# Patient Record
Sex: Male | Born: 1965 | Race: White | Hispanic: No | Marital: Single | Smoking: Former smoker
Health system: Southern US, Community
[De-identification: ages and names within clinical notes are randomized; demographics above are authoritative.]

## PROBLEM LIST (undated history)

## (undated) DIAGNOSIS — K449 Diaphragmatic hernia without obstruction or gangrene: Secondary | ICD-10-CM

## (undated) DIAGNOSIS — K219 Gastro-esophageal reflux disease without esophagitis: Secondary | ICD-10-CM

## (undated) HISTORY — DX: Diaphragmatic hernia without obstruction or gangrene: K44.9

## (undated) HISTORY — PX: TONSILLECTOMY: SUR1361

## (undated) HISTORY — DX: Gastro-esophageal reflux disease without esophagitis: K21.9

---

## 2005-03-07 ENCOUNTER — Emergency Department: Payer: Self-pay | Admitting: Emergency Medicine

## 2007-07-31 ENCOUNTER — Ambulatory Visit: Payer: Self-pay | Admitting: Family Medicine

## 2008-11-26 ENCOUNTER — Ambulatory Visit: Payer: Self-pay | Admitting: Internal Medicine

## 2008-12-06 ENCOUNTER — Ambulatory Visit: Payer: Self-pay | Admitting: Family Medicine

## 2008-12-25 ENCOUNTER — Ambulatory Visit: Payer: Self-pay | Admitting: Internal Medicine

## 2010-12-10 IMAGING — CR RIGHT INDEX FINGER 2+V
1 series · 3 of 3 positions shown · non-contrast
Comparison: None

REASON FOR EXAM: laceration
COMMENTS:

PROCEDURE:     MDR - MDR FINGER INDEX 2ND DIG RT HA  - November 26, 2008  [DATE]
RESULT:     History: Laceration

[Series 1: view not recorded · 0.17mm/px · 3 of 3 slices shown]
[im 1/3]
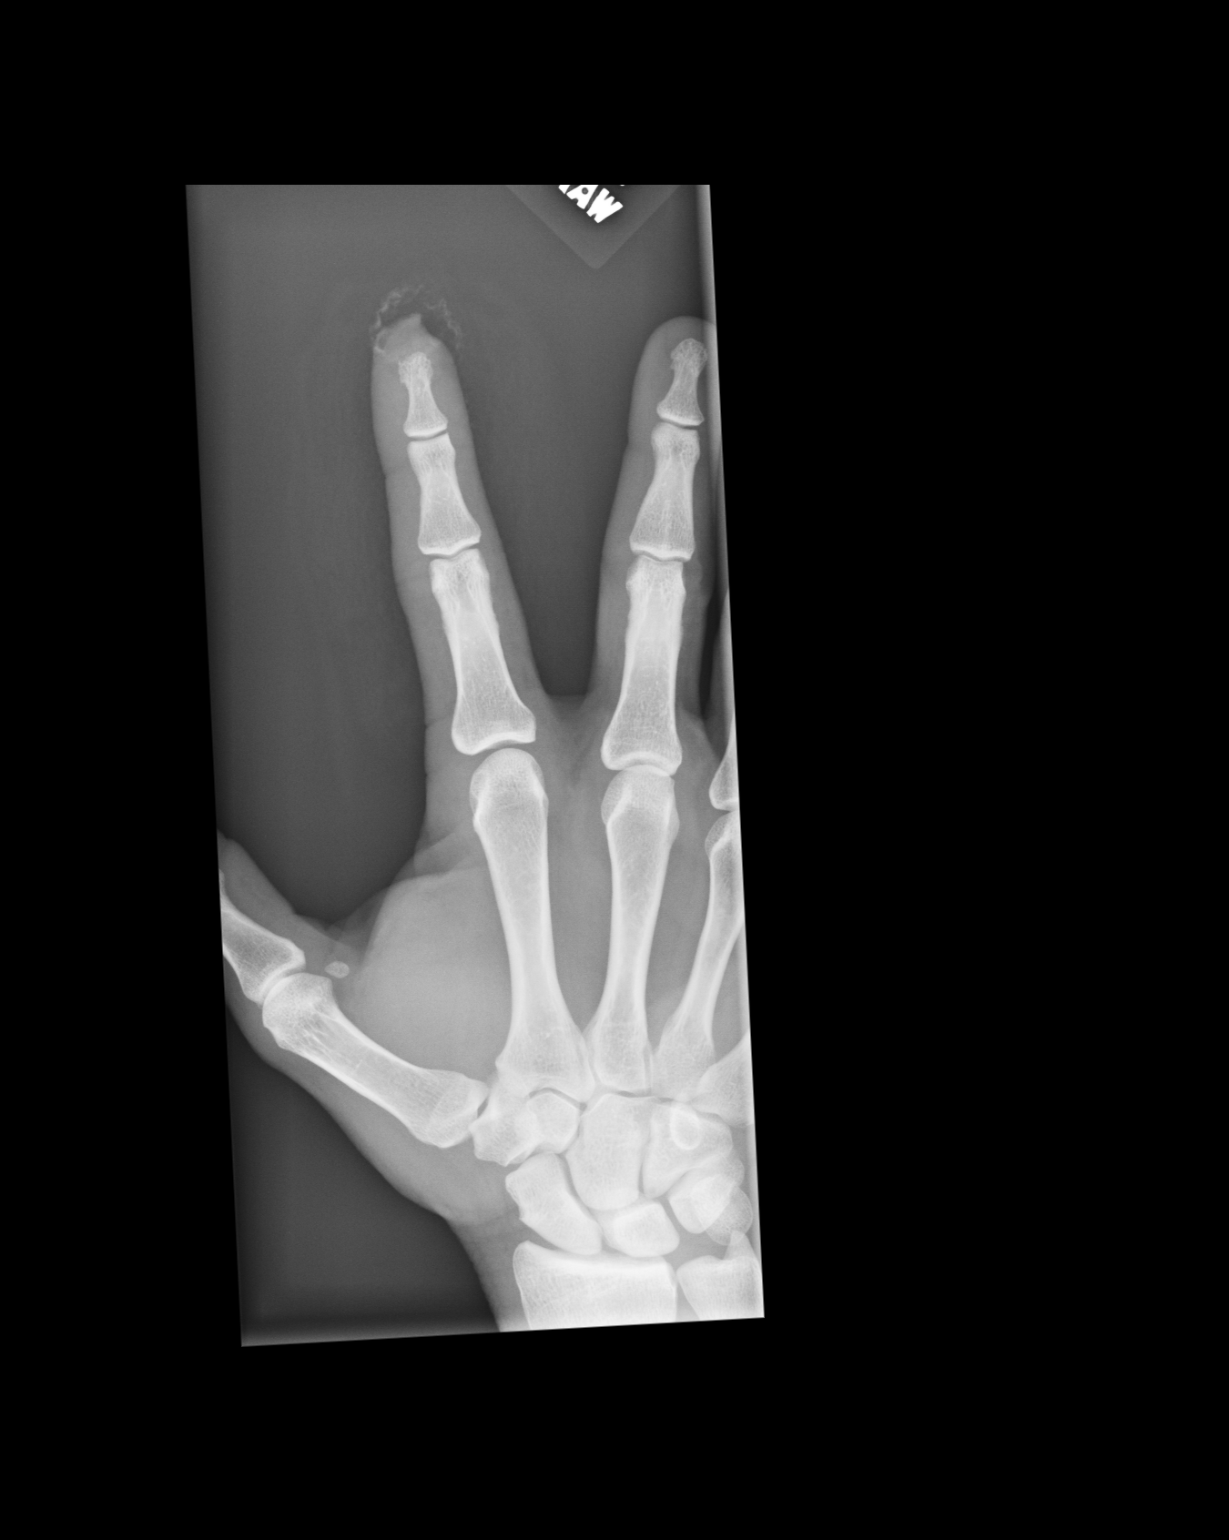
[im 2/3]
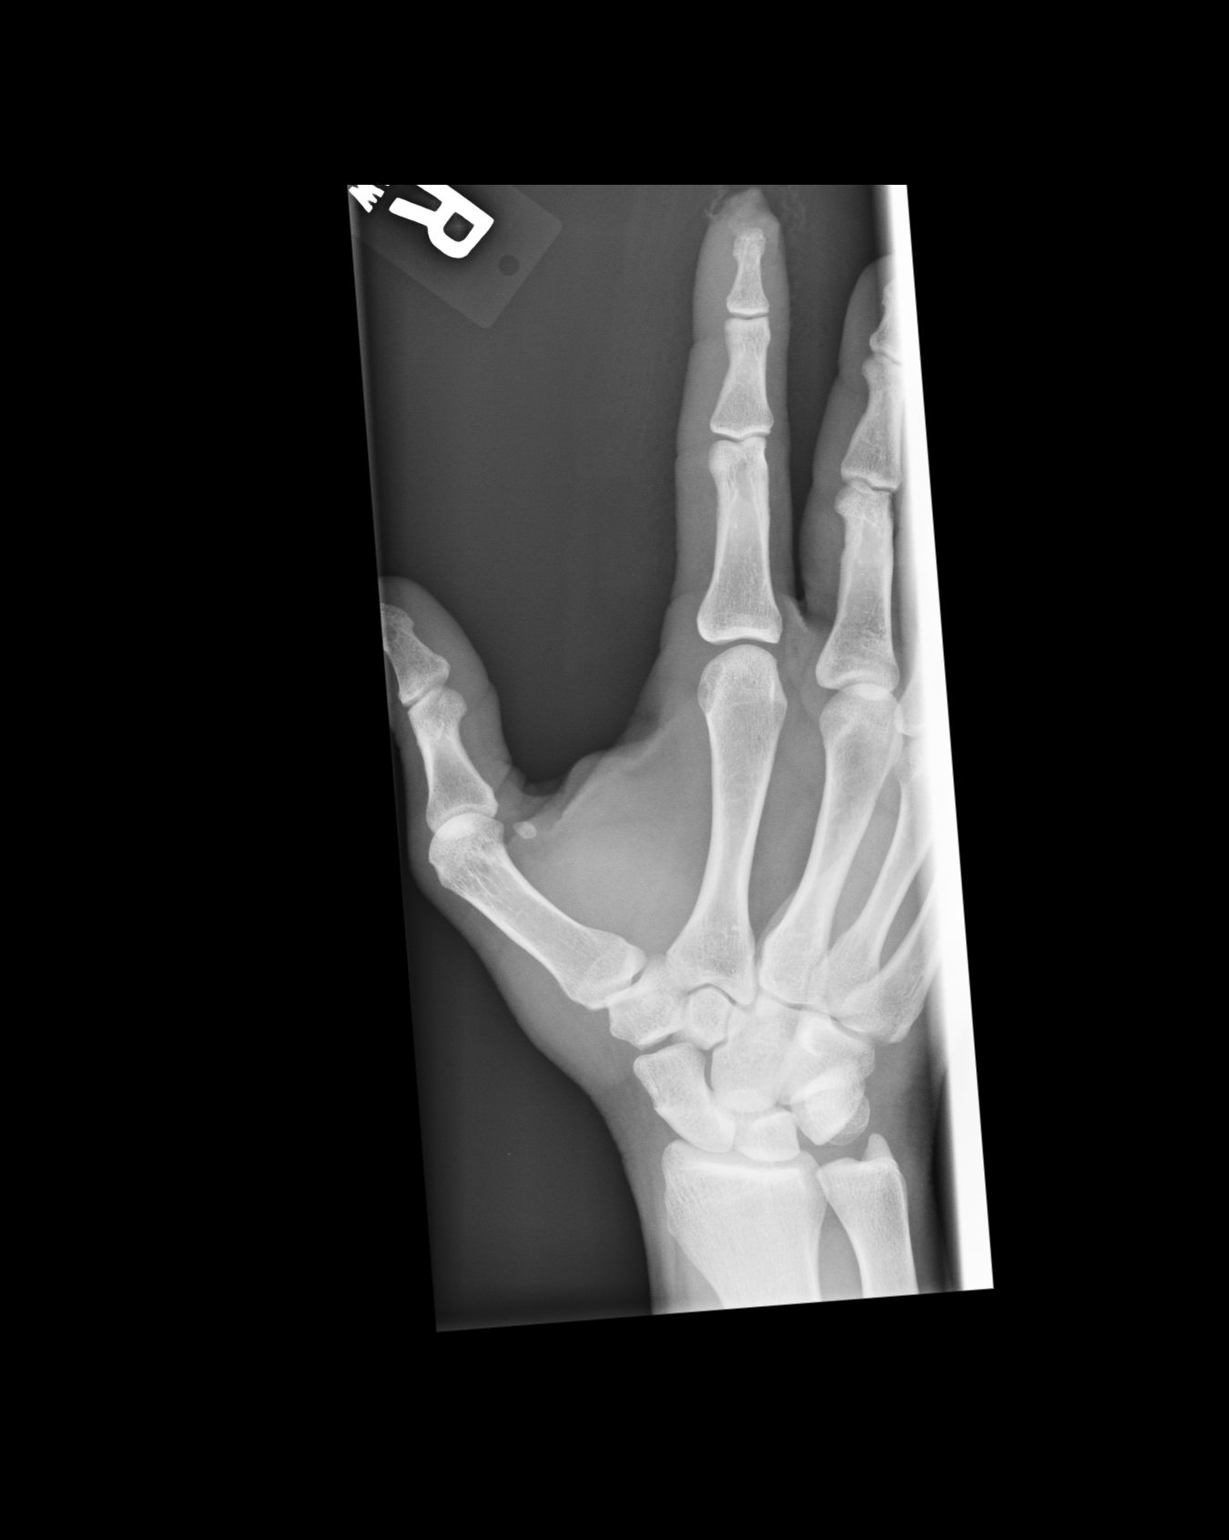
[im 3/3]
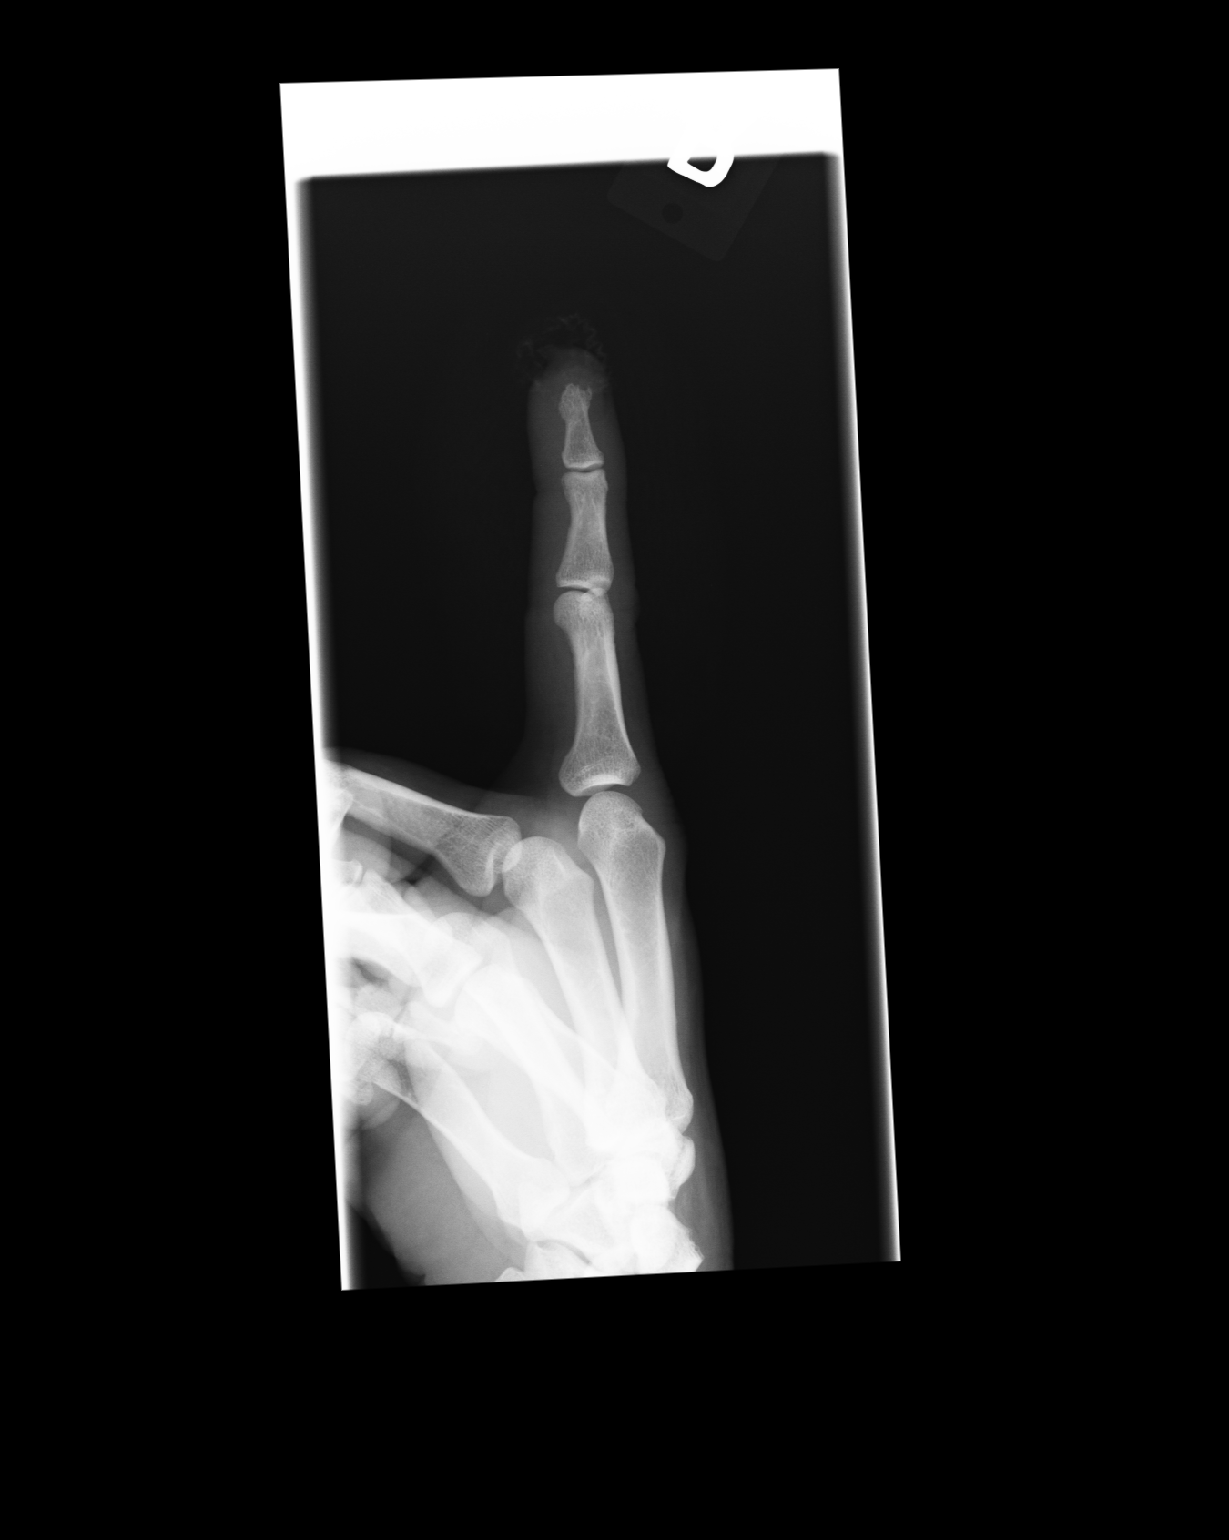

[3 of 3 positions shown; findings below may reference images not displayed]

FINDINGS: Three coned-down views of the right second digit demonstrates a small
osseous fragment distal to the distal tuft of the second phalanx consistent
with a fracture of the tuft. There is a laceration extending to this area.
Given the overall findings is consistent with an open fracture.

No other fractures are identified. There is no dislocation.
IMPRESSION: Please see above

## 2016-03-14 ENCOUNTER — Emergency Department
Admission: EM | Admit: 2016-03-14 | Discharge: 2016-03-14 | Disposition: A | Discharge status: Home / Self Care | Payer: BLUE CROSS/BLUE SHIELD | Attending: Emergency Medicine | Admitting: Emergency Medicine

## 2016-03-14 ENCOUNTER — Encounter: Payer: Self-pay | Admitting: Emergency Medicine

## 2016-03-14 DIAGNOSIS — Z87891 Personal history of nicotine dependence: Secondary | ICD-10-CM | POA: Diagnosis not present

## 2016-03-14 DIAGNOSIS — R202 Paresthesia of skin: Secondary | ICD-10-CM

## 2016-03-14 DIAGNOSIS — R9431 Abnormal electrocardiogram [ECG] [EKG]: Secondary | ICD-10-CM | POA: Insufficient documentation

## 2016-03-14 LAB — BASIC METABOLIC PANEL
ANION GAP: 7 (ref 5–15)
BUN: 26 mg/dL — AB (ref 6–20)
CALCIUM: 9.2 mg/dL (ref 8.9–10.3)
CO2: 26 mmol/L (ref 22–32)
CREATININE: 0.67 mg/dL (ref 0.61–1.24)
Chloride: 105 mmol/L (ref 101–111)
GFR calc Af Amer: >60 mL/min (ref >60)
GLUCOSE: 93 mg/dL (ref 65–99)
Potassium: 3.9 mmol/L (ref 3.5–5.1)
Sodium: 138 mmol/L (ref 135–145)

## 2016-03-14 LAB — CBC
HCT: 41.2 % (ref 40.0–52.0)
Hemoglobin: 14.4 g/dL (ref 13.0–18.0)
MCH: 30.5 pg (ref 26.0–34.0)
MCHC: 35 g/dL (ref 32.0–36.0)
MCV: 87.1 fL (ref 80.0–100.0)
PLATELETS: 221 10*3/uL (ref 150–440)
RBC: 4.73 MIL/uL (ref 4.40–5.90)
RDW: 13.7 % (ref 11.5–14.5)
WBC: 8.9 10*3/uL (ref 3.8–10.6)

## 2016-03-14 LAB — TROPONIN I: Troponin I: <0.03 ng/mL (ref <0.03)

## 2016-03-14 NOTE — ED Triage Notes (Signed)
Pt presents to ED from his PCP who sent him for referral for EKG changes noted today. Pt denies chest pain but reports a funny feeling in his left fingers.

## 2016-03-14 NOTE — Discharge Instructions (Signed)
As we discussed, although your EKG has some abnormalities, there is nothing to suggest you are having heart attack.  Additionally because of your general good health, you are at very low risk for having a serious heart issue which is resulting in EKG changes.  We encourage you to continue taking a baby aspirin each day and to call Dr. Welton FlakesKhan on Monday to schedule the next available clinic appointment.  Return to the emergency department with new or worsening symptoms that concern you.

## 2016-03-14 NOTE — ED Provider Notes (Signed)
[facility] Emergency Department Provider Note  ____________________________________________   First MD Initiated Contact with Patient 03/14/16 2003     (approximate)  I have reviewed the triage vital signs and the nursing notes.   HISTORY  Chief Complaint Other (EKG changes)    HPI Steven Odonnell is a 50 y.o. male with no significant past medical history who presents for evaluation of EKG changes at the request of his primary care doctor.  He came to his primary care doctor earlier today due to some numbness and tingling in the index and middle finger of his right hand which is been intermittent over the last week.  He has felt a little bit run down with some general malaise but no specific symptoms other than the paresthesias.  However when his primary care doctor did an EKG he was concerned about some abnormalities and gave him a full dose aspirin and ask him to go to the emergency department.  Patient denies fever/chills, shortness of breath, abdominal pain, nausea, vomiting, episodes of diaphoresis, dysuria.  He states that over the last week or so he occasionally has "twinges" in his chest, usually when he is working, but does not describe it as pain.  He says it occasionally feels like a mild tingle or tickle but is not prolonged, very minor in severity, nothing in particular makes it better nor worse.  She is he has had no weakness in any of his extremities, just some tingling and numbness in the index and middle finger of his right/dominant hand.  It is worsened when he is working.  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   History reviewed. No pertinent past medical history.  There are no active problems to display for this patient.   Past Surgical History:  Procedure Laterality Date  . TONSILLECTOMY      Prior to Admission medications   Not on File    Allergies Review of patient's  allergies indicates no known allergies.  No family history on file.  Social History Social History  Substance Use Topics  . Smoking status: Former Games developermoker  . Smokeless tobacco: Never Used  . Alcohol use Yes    Review of Systems Constitutional: No fever/chills Eyes: No visual changes. ENT: No sore throat. Cardiovascular: Denies chest pain, occasional chest "twinge" Respiratory: Denies shortness of breath. Gastrointestinal: No abdominal pain.  No nausea, no vomiting.  No diarrhea.  No constipation. Genitourinary: Negative for dysuria. Musculoskeletal: Negative for back pain. Skin: Negative for rash. Neurological: Negative for headaches, focal weakness or numbness.  10-point ROS otherwise negative.  ____________________________________________   PHYSICAL EXAM:  VITAL SIGNS: ED Triage Vitals  Enc Vitals Group     BP 03/14/16 1702 138/84     Pulse Rate 03/14/16 1702 87     Resp 03/14/16 1702 18     Temp 03/14/16 1702 97.7 F (36.5 C)     Temp Source 03/14/16 1702 Oral     SpO2 03/14/16 1702 98 %     Weight 03/14/16 1702 145 lb (65.8 kg)     Height 03/14/16 1702 5\' 6"  (1.676 m)     Head Circumference --      Peak Flow --      Pain Score 03/14/16 1916 2     Pain Loc --      Pain Edu? --      Excl. in GC? --     Constitutional: Alert  and oriented. Well appearing and in no acute distress. Eyes: Conjunctivae are normal. PERRL. EOMI. Head: Atraumatic. Nose: No congestion/rhinnorhea. Mouth/Throat: Mucous membranes are moist.  Oropharynx non-erythematous. Neck: No stridor.  No meningeal signs.   Cardiovascular: Normal rate, regular rhythm. Good peripheral circulation. Grossly normal heart sounds. Respiratory: Normal respiratory effort.  No retractions. Lungs CTAB. Gastrointestinal: Soft and nontender. No distention.  Musculoskeletal: No lower extremity tenderness nor edema. No gross deformities of extremities. Neurologic:  Normal speech and language. No gross focal  neurologic deficits are appreciated.  Skin:  Skin is warm, dry and intact. No rash noted. Psychiatric: Mood and affect are normal. Speech and behavior are normal.  ____________________________________________   LABS (all labs ordered are listed, but only abnormal results are displayed)  Labs Reviewed  BASIC METABOLIC PANEL - Abnormal; Notable for the following:       Result Value   BUN 26 (*)    All other components within normal limits  CBC  TROPONIN I   ____________________________________________  EKG  ED ECG REPORT I, Ivis Nicolson, the attending physician, personally viewed and interpreted this ECG.   Date: 03/14/2016  EKG Time: 17:03  Rate: 89  Rhythm: normal sinus rhythm  Axis: Left axis deviation  Intervals:Incomplete right bundle branch block.  There is appearance almost of U waves in leads V4 and V5  ST&T Change: Non-specific ST segment / T-wave changes, but no evidence of acute ischemia.   ____________________________________________  RADIOLOGY  No results found.  ____________________________________________   PROCEDURES  Procedure(s) performed:   Procedures   Critical Care performed: No ____________________________________________   INITIAL IMPRESSION / ASSESSMENT AND PLAN / ED COURSE  Pertinent labs & imaging results that were available during my care of the patient were reviewed by me and considered in my medical decision making (see chart for details).  She is well-appearing and in no acute distress with no report of chest pain or shortness of breath.  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  His EKG is somewhat odd but not indicative of acute ischemia and his labs are normal and reassuring. HEART score is 2.  Appropriate for outpatient follow-up.  I gave the name and number of a local cardiologist and encouraged him to call on Monday.  He will also continue to take low-dose aspirin.  I gave  my usual and customary return precautions.   ____________________________________________  FINAL CLINICAL IMPRESSION(S) / ED DIAGNOSES  Final diagnoses:  Right hand paresthesia  EKG abnormalities     MEDICATIONS GIVEN DURING THIS VISIT:  Medications - No data to display   NEW OUTPATIENT MEDICATIONS STARTED DURING THIS VISIT:  New Prescriptions   No medications on file      Note:  This document was prepared using Dragon voice recognition software and may include unintentional dictation errors.    Loleta Roseory Abu Heavin, MD 03/14/16 2029

## 2018-03-16 ENCOUNTER — Encounter: Payer: Self-pay | Admitting: Emergency Medicine

## 2018-03-16 ENCOUNTER — Ambulatory Visit
Admission: EM | Admit: 2018-03-16 | Discharge: 2018-03-16 | Disposition: A | Discharge status: Home / Self Care | Payer: BLUE CROSS/BLUE SHIELD | Attending: Family Medicine | Admitting: Family Medicine

## 2018-03-16 ENCOUNTER — Other Ambulatory Visit: Payer: Self-pay

## 2018-03-16 DIAGNOSIS — L03211 Cellulitis of face: Secondary | ICD-10-CM | POA: Diagnosis not present

## 2018-03-16 DIAGNOSIS — H60393 Other infective otitis externa, bilateral: Secondary | ICD-10-CM | POA: Diagnosis not present

## 2018-03-16 MED ORDER — CIPROFLOXACIN-DEXAMETHASONE 0.3-0.1 % OT SUSP: 4.0000 [drp] | Freq: Two times a day (BID) | OTIC | 0 refills | Status: DC

## 2018-03-16 MED ORDER — MUPIROCIN 2 % EX OINT: 1.0000 "application " | TOPICAL_OINTMENT | Freq: Three times a day (TID) | CUTANEOUS | 0 refills | Status: DC

## 2018-03-16 MED ORDER — DOXYCYCLINE HYCLATE 100 MG PO CAPS: 100.0000 mg | ORAL_CAPSULE | Freq: Two times a day (BID) | ORAL | 0 refills | Status: DC

## 2018-03-16 NOTE — ED Provider Notes (Addendum)
[facility]    CSN: [id number] Arrival date & time: 03/16/18  1707     History   Chief Complaint Chief Complaint  Patient presents with  . swelling right ear    HPI Steven Odonnell is a 52 y.o. male.   HPI  52 year old male presents with a swollen right ear over the tragus.  [facility]/2018 with a external otitis he states was very swollen and the practitioner had trouble introducing the speculum into his ear.  He was treated appropriately with Cortisporin otic was doing better.  However the last several days he has noticed swelling of the tragus with redness and tenderness.  Is complaining of similar symptoms to the external otitis he had in the right ear now in the left ear.  He tends to manipulate his ears frequently swears he is not using Q-tips any longer.  He denies any fever or chills.        History reviewed. No pertinent past medical history.  There are no active problems to display for this patient.   Past Surgical History:  Procedure Laterality Date  . TONSILLECTOMY         Home Medications    Prior to Admission medications   Medication Sig Start Date End Date Taking? Authorizing Provider  ciprofloxacin-dexamethasone (CIPRODEX) OTIC suspension Place 4 drops into both ears 2 (two) times daily. 03/16/18   Lutricia Feiloemer, Chalet Kerwin P, PA-C  doxycycline (VIBRAMYCIN) 100 MG capsule Take 1 capsule (100 mg total) by mouth 2 (two) times daily. 03/16/18   Lutricia Feiloemer, Naria Abbey P, PA-C  mupirocin ointment (BACTROBAN) 2 % Apply 1 application topically 3 (three) times daily. 03/16/18   Lutricia Feiloemer, Maevis Mumby P, PA-C    Family History Family History  Problem Relation Age of Onset  . Diabetes Mother   . Hypertension Mother   . Lung cancer Father     Social History Social History   Tobacco Use  . Smoking status: Former Smoker    Last attempt to quit: 03/16/1998    Years since quitting: 20.0  . Smokeless tobacco: Never Used  Substance Use  Topics  . Alcohol use: Not Currently  . Drug use: No     Allergies   Patient has no known allergies.   Review of Systems Review of Systems  Constitutional: Positive for activity change. Negative for appetite change, chills and fatigue.  HENT: Positive for ear pain.   All other systems reviewed and are negative.    Physical Exam Triage Vital Signs ED Triage Vitals  Enc Vitals Group     BP 03/16/18 1726 (!) 137/94     Pulse Rate 03/16/18 1726 98     Resp 03/16/18 1726 16     Temp 03/16/18 1726 98.1 F (36.7 C)     Temp Source 03/16/18 1726 Oral     SpO2 03/16/18 1726 98 %     Weight 03/16/18 1727 165 lb (74.8 kg)     Height 03/16/18 1727 5\' 6"  (1.676 m)     Head Circumference --      Peak Flow --      Pain Score 03/16/18 1726 6     Pain Loc --      Pain Edu? --      Excl. in GC? --    No data found.  Updated Vital Signs BP (!) 137/94 (BP Location: Left Arm)   Pulse 98   Temp 98.1 F (36.7 C) (Oral)   Resp 16  Ht 5\' 6"  (1.676 m)   Wt 165 lb (74.8 kg)   SpO2 98%   BMI 26.63 kg/m   Visual Acuity Right Eye Distance:   Left Eye Distance:   Bilateral Distance:    Right Eye Near:   Left Eye Near:    Bilateral Near:     Physical Exam  Constitutional: He is oriented to person, place, and time. He appears well-developed and well-nourished. No distress.  HENT:  Head: Normocephalic.  Nose: Nose normal.  Mouth/Throat: Oropharynx is clear and moist. No oropharyngeal exudate.  Examination of the right ears shows a swollen tender erythematous tragus extending into the face.  At the posterior superior part of the tragus is a small pustule.  Is mild induration but no fluctuance which is pointing.  Examination  now shows much less swelling but still reveals debris in the canal inferiorly.  The TM was partially occluded with swelling but appeared normal.  The left ear shows pain with movement of the tragus but examination of the canal shows again debris and a moderate  amount of swelling present.  TM is visualized and appears normal but dull.  Eyes: Pupils are equal, round, and reactive to light. Right eye exhibits no discharge. Left eye exhibits no discharge.  Neck: Normal range of motion. Neck supple.  Musculoskeletal: Normal range of motion.  Neurological: He is alert and oriented to person, place, and time.  Skin: Skin is warm and dry. He is not diaphoretic.  Psychiatric: He has a normal mood and affect. His behavior is normal. Judgment and thought content normal.  Nursing note and vitals reviewed.        UC Treatments / Results  Labs (all labs ordered are listed, but only abnormal results are displayed) Labs Reviewed - No data to display  EKG None  Radiology No results found.  Procedures Procedures (including critical care time)  Medications Ordered in UC Medications - No data to display  Initial Impression / Assessment and Plan / UC Course  I have reviewed the triage vital signs and the nursing notes.  Pertinent labs & imaging results that were available during my care of the patient were reviewed by me and considered in my medical decision making (see chart for details).     Plan: 1. Test/x-ray results and diagnosis reviewed with patient 2. rx as per orders; risks, benefits, potential side effects reviewed with patient 3. Recommend supportive treatment with warm compresses applied to the right external ear tragus 3 Imes daily.  Specific and explicit instructions were provided to the patient as well as provided in a narrative form on his AVS.   I will give him a second course of Ciprodex for his ears they appeared to not be completely healed.  Apply Bactroban ointment to the tragus after the warm compresses and I have also placed him on doxycycline for the cellulitis.  Not improving he may return to our clinic or return to his [facility]. 4. F/u prn if symptoms worsen or don't improve  Final  Clinical Impressions(s) / UC Diagnoses   Final diagnoses:  Cellulitis of face  Infective otitis externa of both ears     Discharge Instructions     Wet a washcloth with water place in microwave for 10 to 15 seconds until warm.  Place over right external ear and leave for 10 minutes.  Dry thoroughly and apply mupirocin ointment to the area of redness and pain.  Perform this 3  times daily.  Take doxycycline by mouth until prescription is completed.  Use the eardrops twice daily for 5 to 7 days. Do Not manipulate your ear.  Follow-up with your primary care physician   ED Prescriptions    Medication Sig Dispense Auth. Provider   doxycycline (VIBRAMYCIN) 100 MG capsule Take 1 capsule (100 mg total) by mouth 2 (two) times daily. 20 capsule Ovid Curd P, PA-C   mupirocin ointment (BACTROBAN) 2 % Apply 1 application topically 3 (three) times daily. 22 g Ovid Curd P, PA-C   ciprofloxacin-dexamethasone (CIPRODEX) OTIC suspension  (Status: Discontinued) Place 4 drops into both ears 2 (two) times daily. 7.5 mL Lutricia Feil, PA-C   ciprofloxacin-dexamethasone (CIPRODEX) OTIC suspension Place 4 drops into both ears 2 (two) times daily. 7.5 mL Lutricia Feil, PA-C     Controlled Substance Prescriptions  Controlled Substance Registry consulted? Not Applicable   Lutricia Feil, PA-C 03/16/18 1839    Lutricia Feil, PA-C 03/16/18 1840    Lutricia Feil, PA-C 03/16/18 1937

## 2018-03-16 NOTE — ED Triage Notes (Signed)
Patient in today c/o right ear redness, swelling and pain x 2-3 days. Patient had swimmer's ear ~2 weeks ago and [facility] and was treated with antibiotic drops.

## 2018-03-16 NOTE — Discharge Instructions (Signed)
Wet a washcloth with water place in microwave for 10 to 15 seconds until warm.  Place over right external ear and leave for 10 minutes.  Dry thoroughly and apply mupirocin ointment to the area of redness and pain.  Perform this 3 times daily.  Take doxycycline by mouth until prescription is completed.  Use the eardrops twice daily for 5 to 7 days. Do Not manipulate your ear.  Follow-up with your primary care physician

## 2023-03-22 ENCOUNTER — Ambulatory Visit
Admission: EM | Admit: 2023-03-22 | Discharge: 2023-03-22 | Disposition: A | Discharge status: Home / Self Care | Payer: BC Managed Care – PPO | Attending: Emergency Medicine | Admitting: Emergency Medicine

## 2023-03-22 DIAGNOSIS — N410 Acute prostatitis: Secondary | ICD-10-CM

## 2023-03-22 DIAGNOSIS — K4090 Unilateral inguinal hernia, without obstruction or gangrene, not specified as recurrent: Secondary | ICD-10-CM | POA: Diagnosis not present

## 2023-03-22 LAB — URINALYSIS, W/ REFLEX TO CULTURE (INFECTION SUSPECTED)
Bilirubin Urine: NEGATIVE
Glucose, UA: NEGATIVE mg/dL
Hgb urine dipstick: NEGATIVE
Ketones, ur: NEGATIVE mg/dL
Leukocytes,Ua: NEGATIVE
Nitrite: NEGATIVE
Protein, ur: NEGATIVE mg/dL
Specific Gravity, Urine: 1.02 (ref 1.005–1.030)
Squamous Epithelial / HPF: NONE SEEN /HPF (ref 0–5)
pH: 7 (ref 5.0–8.0)

## 2023-03-22 MED ORDER — CIPROFLOXACIN HCL 500 MG PO TABS: 500.0000 mg | ORAL_TABLET | Freq: Two times a day (BID) | ORAL | 0 refills | Status: AC

## 2023-03-22 NOTE — ED Provider Notes (Signed)
[facility]    CSN: [id number] Arrival date & time: 03/22/23  1217      History   Chief Complaint Chief Complaint  Patient presents with   Abdominal Pain   Testicle Pain   Rectal Pain    HPI Steven Odonnell is a 57 y.o. male.   HPI  56 year old male presents for evaluation of genitourinary complaints that been going on for the past 2 weeks to include burning with urination, possible hematuria, right inguinal pain and swelling, burning in the anus and a feeling of fullness when he sits down.  Patient is concerned he may have prostatitis or a kidney stone.  History reviewed. No pertinent past medical history.  There are no problems to display for this patient.   Past Surgical History:  Procedure Laterality Date   TONSILLECTOMY         Home Medications    Prior to Admission medications   Medication Sig Start Date End Date Taking? Authorizing Provider  ciprofloxacin (CIPRO) 500 MG tablet Take 1 tablet (500 mg total) by mouth 2 (two) times daily for 14 days. 03/22/23 04/05/23 Yes Becky Augusta, NP    Family History Family History  Problem Relation Age of Onset   Diabetes Mother    Hypertension Mother    Lung cancer Father     Social History Social History   Tobacco Use   Smoking status: Former    Current packs/day: 0.00    Types: Cigarettes    Quit date: 03/16/1998    Years since quitting: 25.0   Smokeless tobacco: Never  Vaping Use   Vaping status: Never Used  Substance Use Topics   Alcohol use: Not Currently   Drug use: No     Allergies   Patient has no known allergies.   Review of Systems Review of Systems  Constitutional:  Positive for chills. Negative for fever.  Gastrointestinal:  Positive for abdominal pain and rectal pain. Negative for blood in stool.  Genitourinary:  Positive for dysuria and testicular pain. Negative for penile discharge, penile pain, penile swelling and scrotal swelling.     Physical Exam Triage Vital  Signs ED Triage Vitals [03/22/23 1345]  Encounter Vitals Group     BP      Systolic BP Percentile      Diastolic BP Percentile      Pulse      Resp      Temp      Temp src      SpO2      Weight 160 lb (72.6 kg)     Height      Head Circumference      Peak Flow      Pain Score      Pain Loc      Pain Education      Exclude from Growth Chart    No data found.  Updated Vital Signs BP (!) 163/79 (BP Location: Right Arm)   Pulse (!) 126   Temp 98.5 F (36.9 C) (Oral)   Resp 18   Wt 160 lb (72.6 kg)   SpO2 97%   BMI 25.82 kg/m   Visual Acuity Right Eye Distance:   Left Eye Distance:   Bilateral Distance:    Right Eye Near:   Left Eye Near:    Bilateral Near:     Physical Exam Vitals and nursing note reviewed.  Constitutional:      Appearance: Normal appearance. He is not ill-appearing.  HENT:  Head: Normocephalic and atraumatic.  Abdominal:     Tenderness: There is abdominal tenderness.     Hernia: A hernia is present.     Comments: Patient has palpable swelling to the right inguinal region with a reducible direct hernia.  Genitourinary:    Penis: Normal.      Testes: Normal.     Comments: Patient has mild tenderness with palpation of the right testicle and epididymis.  Left is unremarkable.  Both testicles are normal volume and free of masses with smooth texture.  No scrotal lesions noted.  DRE reveals a tender prostate with smooth texture.  No appreciable external hemorrhoids.  No gross blood. Skin:    General: Skin is warm and dry.     Capillary Refill: Capillary refill takes less than 2 seconds.     Findings: No rash.  Neurological:     General: No focal deficit present.     Mental Status: He is alert and oriented to person, place, and time.      UC Treatments / Results  Labs (all labs ordered are listed, but only abnormal results are displayed) Labs Reviewed  URINALYSIS, W/ REFLEX TO CULTURE (INFECTION SUSPECTED) - Abnormal; Notable for the  following components:      Result Value   Bacteria, UA RARE (*)    All other components within normal limits    EKG   Radiology No results found.  Procedures Procedures (including critical care time)  Medications Ordered in UC Medications - No data to display  Initial Impression / Assessment and Plan / UC Course  I have reviewed the triage vital signs and the nursing notes.  Pertinent labs & imaging results that were available during my care of the patient were reviewed by me and considered in my medical decision making (see chart for details).   Patient is a nontoxic-appearing 57 year old male presenting for evaluation of GI/GU complaints as outlined HPI above.  His exam is concerning for a direct inguinal hernia on the right and I believe this is what is causing the pain in his testicle.  I will refer him to general surgery for evaluation.  There is also concern for possible prostatitis given the degree of tenderness of the patient's prostate upon DRE.  I will order urinalysis to evaluate for presence of UTI.  Urinalysis is negative for leukocyte esterase, nitrates, or protein.  Reflex microscopy shows 6-10 red cells with rare bacteria and calcium oxalate crystals.  No hemoglobin on the dip.  I will treat patient for prostatitis with Cipro 500 mg twice daily for 14 days.  As stated above, I will also refer him to general surgery for discussion about right inguinal hernia repair.   Final Clinical Impressions(s) / UC Diagnoses   Final diagnoses:  Acute prostatitis  Direct inguinal hernia of right side     Discharge Instructions      Take the Cipro 500 mg twice daily with food for 14 days for treatment of prostatitis.  You may use over-the-counter Tylenol and/or ibuprofen according to package instructions as needed for pain.  You may also use over-the-counter Azo Standard according to the package instructions as needed for burning with urination.  I have made a referral  to general surgery for you to discuss repair of your right sided inguinal hernia.  Wear supportive underwear and avoid any heavy lifting or straining if possible.  If that area of swelling in your right groin becomes more prominent, painful, or hard you need to go  to the ER for evaluation.     ED Prescriptions     Medication Sig Dispense Auth. Provider   ciprofloxacin (CIPRO) 500 MG tablet Take 1 tablet (500 mg total) by mouth 2 (two) times daily for 14 days. 28 tablet Becky Augusta, NP      PDMP not reviewed this encounter.   Becky Augusta, NP 03/22/23 [phone]

## 2023-03-22 NOTE — ED Triage Notes (Addendum)
Patient states sx started 2 weeks ago. Burning while urinating, abdominal pain. Testicle pain.  Unsure of swelling in testicles. Anal burning. Feels something in his anus when he sits down.

## 2023-03-22 NOTE — Discharge Instructions (Addendum)
Take the Cipro 500 mg twice daily with food for 14 days for treatment of prostatitis.  You may use over-the-counter Tylenol and/or ibuprofen according to package instructions as needed for pain.  You may also use over-the-counter Azo Standard according to the package instructions as needed for burning with urination.  I have made a referral to general surgery for you to discuss repair of your right sided inguinal hernia.  Wear supportive underwear and avoid any heavy lifting or straining if possible.  If that area of swelling in your right groin becomes more prominent, painful, or hard you need to go to the ER for evaluation.

## 2023-03-27 ENCOUNTER — Telehealth: Payer: Self-pay | Admitting: Internal Medicine

## 2023-03-27 MED ORDER — SULFAMETHOXAZOLE-TRIMETHOPRIM 800-160 MG PO TABS: 1.0000 | ORAL_TABLET | Freq: Two times a day (BID) | ORAL | 0 refills | Status: AC

## 2023-03-27 NOTE — Telephone Encounter (Signed)
I changed it to Bactrim DS

## 2023-04-06 ENCOUNTER — Ambulatory Visit (INDEPENDENT_AMBULATORY_CARE_PROVIDER_SITE_OTHER): Payer: BC Managed Care – PPO | Admitting: Surgery

## 2023-04-06 ENCOUNTER — Encounter: Payer: Self-pay | Admitting: Surgery

## 2023-04-06 VITALS — BP 145/84 | HR 83 | Temp 97.5°F | Ht 66.0 in | Wt 171.6 lb

## 2023-04-06 DIAGNOSIS — K4090 Unilateral inguinal hernia, without obstruction or gangrene, not specified as recurrent: Secondary | ICD-10-CM

## 2023-04-06 NOTE — Progress Notes (Signed)
04/06/2023  Reason for Visit:  Right inguinal hernia  History of Present Illness: Steven Odonnell is a 57 y.o. male presenting for evaluation of a right inguinal hernia.  The patient presented to urgent care on 03/22/2023 due to concerns with pain with urination and burning in the rectal area.  On exam, he was diagnosed with prostatitis and was given a prescription for ciprofloxacin.  He was also diagnosed with a right inguinal hernia on exam referred to [place] for further evaluation.  The patient reports not noticing any issues prior to that visit but does report having some discomfort in the right groin with heavy lifting or pushing.  After his visit to urgent care, his symptoms of prostatitis have resolved but he still has some seldom discomfort in the right groin area particularly when straining or bending down.  He denies any nausea, vomiting, chest pain, shortness of breath.  He also denies any symptoms in the left groin or at the umbilicus.  Past Medical History: History reviewed. No pertinent past medical history.   Past Surgical History: Past Surgical History:  Procedure Laterality Date   TONSILLECTOMY      Home Medications: Prior to Admission medications   Not on File    Allergies: No Known Allergies  Social History:  reports that he quit smoking about 25 years ago. He has never used smokeless tobacco. He reports that he does not currently use alcohol. He reports that he does not use drugs.   Family History: Family History  Problem Relation Age of Onset   Diabetes Mother    Hypertension Mother    Lung cancer Father     Review of Systems: Review of Systems  Constitutional:  Negative for chills and fever.  Respiratory:  Negative for shortness of breath.   Cardiovascular:  Negative for chest pain.  Gastrointestinal:  Positive for abdominal pain. Negative for nausea and vomiting.  Genitourinary:  Positive for dysuria.    Physical Exam BP (!) 145/84   Pulse 83   Temp (!)  97.5 F (36.4 C) (Oral)   Ht 5\' 6"  (1.676 m)   Wt 171 lb 9.6 oz (77.8 kg)   SpO2 97%   BMI 27.70 kg/m  CONSTITUTIONAL: No acute distress, well-nourished HEENT:  Normocephalic, atraumatic, extraocular motion intact. RESPIRATORY:  Lungs are clear, and breath sounds are equal bilaterally. Normal respiratory effort without pathologic use of accessory muscles. CARDIOVASCULAR: Heart is regular without murmurs, gallops, or rubs. GI: The abdomen is soft, nondistended, with some mild discomfort in the right groin.  The patient has a small reducible inguinal hernia which only protrudes with significant coughing.  No palpable hernias in the left groin or at the umbilicus.   NEUROLOGIC:  Motor and sensation is grossly normal.  Cranial nerves are grossly intact. PSYCH:  Alert and oriented to person, place and time. Affect is normal.  Laboratory Analysis: No results found for this or any previous visit (from the past 24 hour(s)).  Imaging: No results found.  Assessment and Plan: This is a 57 y.o. male with a reducible right inguinal hernia.  - Discussed with the patient the findings on exam showing a small reducible right inguinal hernia.  I do not feel any defects in the left groin or at the umbilicus.  Discussed with him that the main driver for surgery in this case would be related to any symptoms that he is having.  Discussed with him how we have patients that have inguinal hernias but without any symptoms  we can potentially do watchful waiting on those versus patients to have more significant symptoms and we scheduled for surgery more regularly.  The patient reports that he does not feel his symptoms are too significant at this point and he would like to wait if possible.  I think for now it is reasonable given that his symptoms are more sporadic and not too severe.  As such, I did recommend to the patient that try to avoid to strenuous activity such as heavy lifting or pushing.  Also recommended that  he can try wearing a hernia belt or truss to help support that area of the groin better.  I finally recommended to the patient that if he starts noticing enlargement of the hernia or worsening pain or discomfort, he should let us know so that we can schedule him for surgery sooner. - Patient understands this plan and all of his questions have been answered.  I spent 30 minutes dedicated to the care of this patient on the date of this encounter to include pre-visit review of records, face-to-face time with the patient discussing diagnosis and management, and any post-visit coordination of care.   Howie Ill, MD [facility]

## 2023-04-06 NOTE — Patient Instructions (Signed)
If you have any concerns or questions, please feel free to call our office.   Inguinal Hernia, Adult An inguinal hernia develops when fat or the intestines push through a weak spot in a muscle where the leg meets the lower abdomen (groin). This creates a bulge. This kind of hernia could also be: In the scrotum, if you are male. In folds of skin around the vagina, if you are male. There are three types of inguinal hernias: Hernias that can be pushed back into the abdomen (are reducible). This type rarely causes pain. Hernias that are not reducible (are incarcerated). Hernias that are not reducible and lose their blood supply (are strangulated). This type of hernia requires emergency surgery. What are the causes? This condition is caused by having a weak spot in the muscles or tissues in your groin. This develops over time. The hernia may poke through the weak spot when you suddenly strain your lower abdominal muscles, such as when you: Lift a heavy object. Strain to have a bowel movement. Constipation can lead to straining. Cough. What increases the risk? This condition is more likely to develop in: Males. Pregnant females. People who: Are overweight. Work in jobs that require long periods of standing or heavy lifting. Have had an inguinal hernia before. Smoke or have lung disease. These factors can lead to long-term (chronic) coughing. What are the signs or symptoms? Symptoms may depend on the size of the hernia. Often, a small inguinal hernia has no symptoms. Symptoms of a larger hernia may include: A bulge in the groin area. This is easier to see when standing. It might not be visible when lying down. Pain or burning in the groin. This may get worse when lifting, straining, or coughing. A dull ache or a feeling of pressure in the groin. An unusual bulge in the scrotum, in males. Symptoms of a strangulated inguinal hernia may include: A bulge in your groin that is very painful and  tender to the touch. A bulge that turns red or purple. Fever, nausea, and vomiting. Inability to have a bowel movement or to pass gas. How is this diagnosed? This condition is diagnosed based on your symptoms, your medical history, and a physical exam. Your health care provider may feel your groin area and ask you to cough. How is this treated? Treatment depends on the size of your hernia and whether you have symptoms. If you do not have symptoms, your health care provider may have you watch your hernia carefully and have you come in for follow-up visits. If your hernia is large or if you have symptoms, you may need surgery to repair the hernia. Follow these instructions at home: Lifestyle Avoid lifting heavy objects. Avoid standing for long periods of time. Do not use any products that contain nicotine or tobacco. These products include cigarettes, chewing tobacco, and vaping devices, such as e-cigarettes. If you need help quitting, ask your health care provider. Maintain a healthy weight. Preventing constipation You may need to take these actions to prevent or treat constipation: Drink enough fluid to keep your urine pale yellow. Take over-the-counter or prescription medicines. Eat foods that are high in fiber, such as beans, whole grains, and fresh fruits and vegetables. Limit foods that are high in fat and processed sugars, such as fried or sweet foods. General instructions You may try to push the hernia back in place by very gently pressing on it while lying down. Do not try to force the bulge back in if  it will not push in easily. Watch your hernia for any changes in shape, size, or color. Get help right away if you notice any changes. Take over-the-counter and prescription medicines only as told by your health care provider. Keep all follow-up visits. This is important. Contact a health care provider if: You have a fever or chills. You develop new symptoms. Your symptoms get  worse. Get help right away if: You have pain in your groin that suddenly gets worse. You have a bulge in your groin that: Suddenly gets bigger and does not get smaller. Becomes red or purple or painful to the touch. You are a man and you have a sudden pain in your scrotum, or the size of your scrotum suddenly changes. You cannot push the hernia back in place by very gently pressing on it when you are lying down. You have nausea or vomiting that does not go away. You have a fast heartbeat. You cannot have a bowel movement or pass gas. These symptoms may represent a serious problem that is an emergency. Do not wait to see if the symptoms will go away. Get medical help right away. Call your local emergency services (911 in the [place] Summary An inguinal hernia develops when fat or the intestines push through a weak spot in a muscle where your leg meets your lower abdomen (groin). This condition is caused by having a weak spot in muscles or tissues in your groin. Symptoms may depend on the size of the hernia, and they may include pain or swelling in your groin. A small inguinal hernia often has no symptoms. Treatment may not be needed if you do not have symptoms. If you have symptoms or a large hernia, you may need surgery to repair the hernia. Avoid lifting heavy objects. Also, avoid standing for long periods of time. This information is not intended to replace advice given to you by your health care provider. Make sure you discuss any questions you have with your health care provider. Document Revised: 03/13/2020 Document Reviewed: 03/13/2020 Elsevier Patient Education  2024 [employer].

## 2023-04-09 ENCOUNTER — Telehealth: Payer: Self-pay | Admitting: *Deleted

## 2023-04-09 NOTE — Telephone Encounter (Signed)
Patient called and was seen here on 04/06/23 with Dr Aleen Campi for inguinal hernia, he is now having a burning sensation in the area. He stated that is started last night and its been off and on. He does a lot of heavy lifting at work. Patient wants to know what he should do or if this normal? Please call and advise

## 2023-05-08 ENCOUNTER — Ambulatory Visit
Admission: EM | Admit: 2023-05-08 | Discharge: 2023-05-08 | Disposition: A | Discharge status: Home / Self Care | Payer: BC Managed Care – PPO | Attending: Physician Assistant | Admitting: Physician Assistant

## 2023-05-08 ENCOUNTER — Ambulatory Visit (INDEPENDENT_AMBULATORY_CARE_PROVIDER_SITE_OTHER): Payer: BC Managed Care – PPO

## 2023-05-08 DIAGNOSIS — R0789 Other chest pain: Secondary | ICD-10-CM | POA: Insufficient documentation

## 2023-05-08 DIAGNOSIS — R079 Chest pain, unspecified: Secondary | ICD-10-CM | POA: Diagnosis not present

## 2023-05-08 DIAGNOSIS — R9431 Abnormal electrocardiogram [ECG] [EKG]: Secondary | ICD-10-CM | POA: Insufficient documentation

## 2023-05-08 DIAGNOSIS — R059 Cough, unspecified: Secondary | ICD-10-CM | POA: Diagnosis not present

## 2023-05-08 DIAGNOSIS — K219 Gastro-esophageal reflux disease without esophagitis: Secondary | ICD-10-CM

## 2023-05-08 DIAGNOSIS — Z79899 Other long term (current) drug therapy: Secondary | ICD-10-CM | POA: Insufficient documentation

## 2023-05-08 DIAGNOSIS — R911 Solitary pulmonary nodule: Secondary | ICD-10-CM

## 2023-05-08 DIAGNOSIS — R051 Acute cough: Secondary | ICD-10-CM | POA: Diagnosis not present

## 2023-05-08 DIAGNOSIS — Z87891 Personal history of nicotine dependence: Secondary | ICD-10-CM | POA: Diagnosis not present

## 2023-05-08 DIAGNOSIS — R918 Other nonspecific abnormal finding of lung field: Secondary | ICD-10-CM | POA: Diagnosis not present

## 2023-05-08 LAB — CBC WITH DIFFERENTIAL/PLATELET
Abs Immature Granulocytes: 0.05 10*3/uL (ref 0.00–0.07)
Basophils Absolute: 0.1 10*3/uL (ref 0.0–0.1)
Basophils Relative: 1 %
Eosinophils Absolute: 0.2 10*3/uL (ref 0.0–0.5)
Eosinophils Relative: 2 %
HCT: 43.8 % (ref 39.0–52.0)
Hemoglobin: 14.7 g/dL (ref 13.0–17.0)
Immature Granulocytes: 1 %
Lymphocytes Relative: 30 %
Lymphs Abs: 2.7 10*3/uL (ref 0.7–4.0)
MCH: 29.4 pg (ref 26.0–34.0)
MCHC: 33.6 g/dL (ref 30.0–36.0)
MCV: 87.6 fL (ref 80.0–100.0)
Monocytes Absolute: 0.7 10*3/uL (ref 0.1–1.0)
Monocytes Relative: 8 %
Neutro Abs: 5.4 10*3/uL (ref 1.7–7.7)
Neutrophils Relative %: 58 %
Platelets: 291 10*3/uL (ref 150–400)
RBC: 5 MIL/uL (ref 4.22–5.81)
RDW: 12.8 % (ref 11.5–15.5)
WBC: 9.1 10*3/uL (ref 4.0–10.5)
nRBC: 0 % (ref 0.0–0.2)

## 2023-05-08 LAB — COMPREHENSIVE METABOLIC PANEL
ALT: 29 U/L (ref 0–44)
AST: 32 U/L (ref 15–41)
Albumin: 4.5 g/dL (ref 3.5–5.0)
Alkaline Phosphatase: 98 U/L (ref 38–126)
Anion gap: 10 (ref 5–15)
BUN: 21 mg/dL — ABNORMAL HIGH (ref 6–20)
CO2: 23 mmol/L (ref 22–32)
Calcium: 9.3 mg/dL (ref 8.9–10.3)
Chloride: 105 mmol/L (ref 98–111)
Creatinine, Ser: 0.89 mg/dL (ref 0.61–1.24)
GFR, Estimated: >60 mL/min (ref >60)
Glucose, Bld: 117 mg/dL — ABNORMAL HIGH (ref 70–99)
Potassium: 4.2 mmol/L (ref 3.5–5.1)
Sodium: 138 mmol/L (ref 135–145)
Total Bilirubin: 0.7 mg/dL (ref 0.3–1.2)
Total Protein: 8.3 g/dL — ABNORMAL HIGH (ref 6.5–8.1)

## 2023-05-08 LAB — TROPONIN I (HIGH SENSITIVITY): Troponin I (High Sensitivity): 3 ng/L (ref <18)

## 2023-05-08 LAB — LIPASE, BLOOD: Lipase: 36 U/L (ref 11–51)

## 2023-05-08 MED ORDER — ALUM & MAG HYDROXIDE-SIMETH 200-200-20 MG/5ML PO SUSP
30.0000 mL | Freq: Once | ORAL | Status: AC
  Administered 2023-05-08: 30 mL via ORAL

## 2023-05-08 MED ORDER — LIDOCAINE VISCOUS HCL 2 % MT SOLN
15.0000 mL | Freq: Once | OROMUCOSAL | Status: AC
  Administered 2023-05-08: 15 mL via ORAL

## 2023-05-08 MED ORDER — PANTOPRAZOLE SODIUM 40 MG PO TBEC: 40.0000 mg | DELAYED_RELEASE_TABLET | Freq: Every day | ORAL | 0 refills | Status: DC

## 2023-05-08 NOTE — Discharge Instructions (Addendum)
-  Your lab work looks great. - No significant abnormalities on your EKG. - Still waiting on the results of your chest x-ray.  If it shows pneumonia I will contact you in the next couple of hours and send antibiotics. - Symptoms seem to be related to acid reflux.  I sent a stronger antacid to the pharmacy for you.  After you out of this medication if you still feel the discomfort take the over-the-counter Nexium and follow-up with your primary care provider. - Increase rest and fluids.  Smaller meals.  Avoid spicy foods.  Do not eat 2 hours before bedtime. - Avoid NSAIDs such as ibuprofen and Aleve.  Tylenol for pain if needed. - If you have worsening chest pain, shortness of breath, sweats, weakness, fever go to the ER. - If you have pneumonia you will need to repeat the x-ray in about 4 weeks.

## 2023-05-08 NOTE — ED Provider Notes (Signed)
[facility]    CSN: [id number] Arrival date & time: 05/08/23  0808      History   Chief Complaint Chief Complaint  Patient presents with   Chest Pain    HPI Steven Odonnell is a 57 y.o. male presenting for approximately 1 week history of right sided chest discomfort.  Also reports the discomfort is a little bit in the center of the chest 2.  It is intermittent.  He describes it as a burning sensation with occasional sharp pains.  Pain seems to be a little worse with movement and eating certain foods such as spicy foods.  He does report that he has some pain in his back as well, epigastric area and right upper quadrant but most discomfort is in the right chest.  Has had a cough, postnasal drainage and congestion for the past 2 to 3 weeks.  Reports the sinus congestion has improved.  Denies sinus pain.  Denies fever, fatigue, dizziness, palpitations, shortness of breath, wheezing, difficulty breathing, vomiting.  Has had a little bit of nausea.  Patient believes his symptoms could be acid reflux.  He has taken Nexium and says it has helped.  He also reports the discomfort is worse in the morning and he has the need to clear his throat in the morning and has a "bad taste in the mouth" in the morning.  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  Denies any injuries or falls.  Patient says he is fairly healthy.  Denies any medical problems that he knows of but does report he has not seen a provider in a long time.  Former smoker.  Denies alcohol use.  No history of cardiopulmonary problems.  States he wants to make sure nothing is wrong with his heart.  He has been looking things up online and says that has caused him to feel "panicked."  No other complaints.  HPI  History reviewed. No pertinent past medical history.  There are no problems to display for this patient.   Past Surgical History:  Procedure Laterality Date   TONSILLECTOMY          Home Medications    Prior to Admission medications   Medication Sig Start Date End Date Taking? Authorizing Provider  pantoprazole (PROTONIX) 40 MG tablet Take 1 tablet (40 mg total) by mouth daily. 05/08/23 06/07/23 Yes Shirlee Latch, PA-C    Family History Family History  Problem Relation Age of Onset   Diabetes Mother    Hypertension Mother    Lung cancer Father     Social History Social History   Tobacco Use   Smoking status: Former    Current packs/day: 0.00    Types: Cigarettes    Quit date: 03/16/1998    Years since quitting: 25.1   Smokeless tobacco: Never  Vaping Use   Vaping status: Never Used  Substance Use Topics   Alcohol use: Not Currently   Drug use: No     Allergies   Patient has no known allergies.   Review of Systems Review of Systems  Constitutional:  Negative for diaphoresis, fatigue and fever.  HENT:  Positive for congestion and postnasal drip. Negative for sinus pain and sore throat.   Respiratory:  Positive for cough. Negative for shortness of breath and wheezing.   Cardiovascular:  Positive for chest pain. Negative for palpitations.  Gastrointestinal:  Positive for nausea. Negative for abdominal pain, diarrhea and vomiting.  Musculoskeletal:  Positive for back pain.  Neurological:  Negative for dizziness, syncope, weakness, numbness and headaches.     Physical Exam Triage Vital Signs ED Triage Vitals  Encounter Vitals Group     BP      Systolic BP Percentile      Diastolic BP Percentile      Pulse      Resp      Temp      Temp src      SpO2      Weight      Height      Head Circumference      Peak Flow      Pain Score      Pain Loc      Pain Education      Exclude from Growth Chart    No data found.  Updated Vital Signs BP 137/83 (BP Location: Left Arm)   Pulse (!) 111   Temp 98.4 F (36.9 C) (Oral)   Ht 5\' 6"  (1.676 m)   Wt 170 lb (77.1 kg)   SpO2 100%   BMI 27.44 kg/m     Physical Exam Vitals  and nursing note reviewed.  Constitutional:      General: He is not in acute distress.    Appearance: Normal appearance. He is well-developed. He is not ill-appearing.  HENT:     Head: Normocephalic and atraumatic.     Nose: Nose normal.     Mouth/Throat:     Mouth: Mucous membranes are dry.     Pharynx: Oropharynx is clear.  Eyes:     General: No scleral icterus.    Conjunctiva/sclera: Conjunctivae normal.  Cardiovascular:     Rate and Rhythm: Normal rate and regular rhythm.     Heart sounds: Normal heart sounds.  Pulmonary:     Effort: Pulmonary effort is normal. No respiratory distress.     Breath sounds: Normal breath sounds.     Comments: No tenderness of chest or back. Chest:     Chest wall: No tenderness.  Abdominal:     Palpations: Abdomen is soft.     Tenderness: There is abdominal tenderness (epigastric, RUQ. Negative Murphy sign).  Musculoskeletal:     Cervical back: Neck supple.  Skin:    General: Skin is warm and dry.     Capillary Refill: Capillary refill takes less than 2 seconds.  Neurological:     General: No focal deficit present.     Mental Status: He is alert.     Motor: No weakness.     Gait: Gait normal.  Psychiatric:        Mood and Affect: Mood is anxious.      UC Treatments / Results  Labs (all labs ordered are listed, but only abnormal results are displayed) Labs Reviewed  COMPREHENSIVE METABOLIC PANEL - Abnormal; Notable for the following components:      Result Value   Glucose, Bld 117 (*)    BUN 21 (*)    Total Protein 8.3 (*)    All other components within normal limits  CBC WITH DIFFERENTIAL/PLATELET  LIPASE, BLOOD  TROPONIN I (HIGH SENSITIVITY)    EKG   Radiology DG Chest 2 View  Result Date: 05/08/2023 CLINICAL DATA:  Right-sided chest pain and cough for 2-3 weeks. EXAM: CHEST - 2 VIEW COMPARISON:  None Available. FINDINGS: Heart size is normal. No pleural fluid, interstitial edema or airspace disease. Within the right  upper lobe there is an ovoid, smoothly marginated  nodular opacity measuring 1.6 cm. The visualized osseous structures are unremarkable. IMPRESSION: 1. No acute cardiopulmonary abnormalities. 2. Right upper lobe nodular opacity measuring 1.6 cm. Recommend further evaluation with nonemergent, noncontrast chest CT. These results will be called to the ordering clinician or representative by the Radiologist Assistant, and communication documented in the PACS or [employer]. Electronically Signed   By: Signa Kell M.D.   On: 05/08/2023 10:48    Procedures ED EKG  Date/Time: 05/08/2023 8:41 AM  Performed by: Shirlee Latch, PA-C Authorized by: Shirlee Latch, PA-C   Previous ECG:    Previous ECG:  Unavailable Interpretation:    Interpretation: abnormal   Rate:    ECG rate:  105   ECG rate assessment: tachycardic   Rhythm:    Rhythm: sinus rhythm   Ectopy:    Ectopy: none   QRS:    QRS axis:  Left   QRS intervals:  Normal   QRS conduction: RBBB     Details:  Incomplete ST segments:    ST segments:  Normal T waves:    T waves: normal   Other findings:    Other findings: LAE   Comments:     Sinus tachycardia, Possible LAE. Left axis deviation, incomplete RBBB  (including critical care time)  Medications Ordered in UC Medications  alum & mag hydroxide-simeth (MAALOX/MYLANTA) 200-200-20 MG/5ML suspension 30 mL (30 mLs Oral Given 05/08/23 0855)    And  lidocaine (XYLOCAINE) 2 % viscous mouth solution 15 mL (15 mLs Oral Given 05/08/23 0855)    Initial Impression / Assessment and Plan / UC Course  I have reviewed the triage vital signs and the nursing notes.  Pertinent labs & imaging results that were available during my care of the patient were reviewed by me and considered in my medical decision making (see chart for details).   39 male presents for right sided and central chest pain for the past 1 week.  Reports a nasty taste in his throat.  Symptoms worse after eating  and with movement.  No associated fever or shortness of breath but he has been coughing and congested for the past couple of weeks.  No history of cardiopulmonary disease.  Patient is afebrile.  His pulse is elevated but he is little anxious.  On exam mouth is dry.  No chest or back tenderness.  Mild tenderness of the epigastric and right upper quadrant regions.  Chest clear.  Heart regular rhythm.  EKG performed today shows sinus tachycardia with incomplete right bundle branch block.  No ST or T wave abnormalities.  Ordered chest x-ray to assess for possible pneumonia given that he has been coughing and congested for a few weeks.  Ordered CBC, CMP, lipase and troponin.  Lab work is overall reassuring.  Slightly elevated glucose of 117 and BUN of 21, otherwise all normal labs.  Patient was given GI cocktail which she reported improved his symptoms.  Pending results of chest x-ray but if he has pneumonia we will treat with antibiotics.  Symptoms consistent with GERD.  We discussed avoiding NSAIDs and discussed dietary changes.  Discussed increasing rest and fluids.  Smaller meals.  Overall lifestyle modifications.  Sent pantoprazole to pharmacy.  Work note was provided.  Advised patient to go to emergency department if chest pain worsens or he has fever, shortness of breath, palpitations, sweats, etc.  Radiologist noted 1.6 cm nodular opacity of the right upper lobe.  Recommends noncontrast nonemergent CT scan.  Patient does  not currently have a PCP.  Contacted patient and discussed following up on this to ensure it is not cancerous and if it is and he will need to be referred elsewhere.  Patient is understanding.  Appointment scheduled for June 29, 2023 at 1:40 PM at [facility] medical primary care office.  Explained to patient how to get to the appointment.  Patient confirmed that he wrote down the time and day.   Final Clinical Impressions(s) / UC Diagnoses   Final diagnoses:  Chest pain,  unspecified type  Gastroesophageal reflux disease, unspecified whether esophagitis present  Acute cough  Solitary pulmonary nodule     Discharge Instructions      -Your lab work looks great. - No significant abnormalities on your EKG. - Still waiting on the results of your chest x-ray.  If it shows pneumonia I will contact you in the next couple of hours and send antibiotics. - Symptoms seem to be related to acid reflux.  I sent a stronger antacid to the pharmacy for you.  After you out of this medication if you still feel the discomfort take the over-the-counter Nexium and follow-up with your primary care provider. - Increase rest and fluids.  Smaller meals.  Avoid spicy foods.  Do not eat 2 hours before bedtime. - Avoid NSAIDs such as ibuprofen and Aleve.  Tylenol for pain if needed. - If you have worsening chest pain, shortness of breath, sweats, weakness, fever go to the ER. - If you have pneumonia you will need to repeat the x-ray in about 4 weeks.     ED Prescriptions     Medication Sig Dispense Auth. Provider   pantoprazole (PROTONIX) 40 MG tablet Take 1 tablet (40 mg total) by mouth daily. 30 tablet Gareth Morgan      PDMP not reviewed this encounter.   Shirlee Latch, PA-C 05/08/23 1105

## 2023-05-08 NOTE — ED Triage Notes (Signed)
Pt c/o chest pain x1week  Pt has a history of acid reflux and states that he might have anxiety.  Pt states that he recently had a sinus infection 3 weeks ago and gets a bad taste in his throat when he lays down. Pt declines coughing and states that he only coughs to clear his throat in the morning.   Pt has been using OTC nexium  Pt has worsening pain after eating.   Pt denies being lightheaded, dizzy, sweating, or headaches. Pt denies any previous heart issues.  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

## 2023-05-16 ENCOUNTER — Emergency Department: Payer: BC Managed Care – PPO

## 2023-05-16 ENCOUNTER — Other Ambulatory Visit: Payer: Self-pay

## 2023-05-16 ENCOUNTER — Emergency Department
Admission: EM | Admit: 2023-05-16 | Discharge: 2023-05-16 | Disposition: A | Discharge status: Home / Self Care | Payer: BC Managed Care – PPO | Attending: Emergency Medicine | Admitting: Emergency Medicine

## 2023-05-16 ENCOUNTER — Encounter: Payer: Self-pay | Admitting: Emergency Medicine

## 2023-05-16 DIAGNOSIS — R000 Tachycardia, unspecified: Secondary | ICD-10-CM | POA: Insufficient documentation

## 2023-05-16 DIAGNOSIS — R1013 Epigastric pain: Secondary | ICD-10-CM | POA: Diagnosis not present

## 2023-05-16 LAB — URINALYSIS, ROUTINE W REFLEX MICROSCOPIC
Bilirubin Urine: NEGATIVE
Glucose, UA: NEGATIVE mg/dL
Hgb urine dipstick: NEGATIVE
Ketones, ur: NEGATIVE mg/dL
Leukocytes,Ua: NEGATIVE
Nitrite: NEGATIVE
Protein, ur: NEGATIVE mg/dL
Specific Gravity, Urine: 1.027 (ref 1.005–1.030)
pH: 5 (ref 5.0–8.0)

## 2023-05-16 MED ORDER — SUCRALFATE 1 G PO TABS
1.0000 g | ORAL_TABLET | Freq: Once | ORAL | Status: AC
  Administered 2023-05-16: 1 g via ORAL
  Filled 2023-05-16: qty 1

## 2023-05-16 MED ORDER — SUCRALFATE 1 G PO TABS: 1.0000 g | ORAL_TABLET | Freq: Three times a day (TID) | ORAL | 0 refills | Status: DC

## 2023-05-16 MED ORDER — LIDOCAINE VISCOUS HCL 2 % MT SOLN
15.0000 mL | Freq: Once | OROMUCOSAL | Status: AC
  Administered 2023-05-16: 15 mL via OROMUCOSAL
  Filled 2023-05-16: qty 15

## 2023-05-16 MED ORDER — ALUM & MAG HYDROXIDE-SIMETH 200-200-20 MG/5ML PO SUSP
30.0000 mL | Freq: Once | ORAL | Status: AC
  Administered 2023-05-16: 30 mL via ORAL
  Filled 2023-05-16: qty 30

## 2023-05-16 NOTE — ED Triage Notes (Signed)
Pt via POV from home. Pt here with multiple complaints. Pt states he was having CP started couple of days ago and reported that he went to UC and he was dx with acid reflux. States that since this he has been taking the medication prescribed. Pt c/o midsternal chest pain that radiates to midback. Pt also report abd pain and diarrhea. States that he has be nausea and reports that he has been dry mouth and bad taste in his mouth. Medication prescribed has not been helping. Pt is A&Ox4 and NAD

## 2023-05-16 NOTE — ED Provider Notes (Signed)
[facility] Provider Note    Event Date/Time   First MD Initiated Contact with Patient 05/16/23 1214     (approximate)   History   Abdominal Pain   HPI  Steven Odonnell is a 57 year old male presenting to the emergency department for evaluation of epigastric pain.  For the last several weeks, patient has had some intermittent epigastric pain.  Worse after eating.  Reports it as a burning sensation that feels like reflux.  He was seen in urgent care a week ago for similar symptoms.  At that time, he had labs performed including CBC, CMP, lipase, troponin, EKG.  These were reassuring.  Patient was discharged with suspected GERD.  He was placed on pantoprazole.  Reports he has been taking this, but has not had significant improvement in his symptoms.  Denies any significant change or worsening in symptoms.  No fevers or chills.  A few episodes of diarrhea, no vomiting.    Physical Exam   Triage Vital Signs: ED Triage Vitals  Encounter Vitals Group     BP 05/16/23 1128 (!) 155/86     Systolic BP Percentile --      Diastolic BP Percentile --      Pulse Rate 05/16/23 1128 (!) 111     Resp 05/16/23 1128 18     Temp 05/16/23 1128 98.5 F (36.9 C)     Temp Source 05/16/23 1128 Oral     SpO2 05/16/23 1128 98 %     Weight 05/16/23 1125 170 lb (77.1 kg)     Height 05/16/23 1125 5\' 6"  (1.676 m)     Head Circumference --      Peak Flow --      Pain Score 05/16/23 1124 5     Pain Loc --      Pain Education --      Exclude from Growth Chart --     Most recent vital signs: Vitals:   05/16/23 1128  BP: (!) 155/86  Pulse: (!) 111  Resp: 18  Temp: 98.5 F (36.9 C)  SpO2: 98%     General: Awake, interactive  CV:  Regular rate, good peripheral perfusion.  Resp:  Lungs clear, unlabored respirations.  Abd:  Soft, nondistended, mild tenderness in the epigastric region, remainder abdomen nontender, no rebound or guarding Neuro:  Symmetric facial movement, fluid  speech   ED Results / Procedures / Treatments   Labs (all labs ordered are listed, but only abnormal results are displayed) Labs Reviewed  URINALYSIS, ROUTINE W REFLEX MICROSCOPIC - Abnormal; Notable for the following components:      Result Value   Color, Urine YELLOW (*)    APPearance CLEAR (*)    All other components within normal limits     EKG EKG independently reviewed interpreted by myself (ER attending) demonstrates:  EKG demonstrates normal sinus rhythm rate of 100, PR 174, QRS 98, QTc 410, no acute ST changes, incomplete right bundle branch morphology noted  RADIOLOGY Imaging independently reviewed and interpreted by myself demonstrates:  CXR initially ordered, patient declined which I do think is reasonable given recent x-Jah Alarid at urgent care, no change in symptoms  PROCEDURES:  Critical Care performed: No  Procedures   MEDICATIONS ORDERED IN ED: Medications  alum & mag hydroxide-simeth (MAALOX/MYLANTA) 200-200-20 MG/5ML suspension 30 mL (30 mLs Oral Given 05/16/23 1323)  lidocaine (XYLOCAINE) 2 % viscous mouth solution 15 mL (15 mLs Mouth/Throat Given 05/16/23 1325)  sucralfate (CARAFATE) tablet 1 g (  1 g Oral Given 05/16/23 1323)     IMPRESSION / MDM / ASSESSMENT AND PLAN / ED COURSE  I reviewed the triage vital signs and the nursing notes.  Differential diagnosis includes, but is not limited to, GERD, gastritis, peptic ulcer disease, pancreatitis, very low suspicion ACS, PE, other emergent pathology based on clinical history and negative workup recently  Patient's presentation is most consistent with acute complicated illness / injury requiring diagnostic workup.  57 year old male presenting with ongoing epigastric pain.  Mildly tachycardic on presentation, heart rate normalized at the time of my evaluation.  I did review his recent workup from urgent care from about a week ago.  This was reassuring.  Patient has not had any change in symptoms.  His  description is very atypical for cardiac chest pain.  Low suspicion for significant acute pathology.  EKG reassuring.  Recent x-Areonna Bran at outside facility with likely incidental pulmonary nodule, outpatient follow-up arranged.  Discussed with patient that I do suspect his symptoms are consistent with GI cause.  I recommended that he continue taking his pantoprazole, was treated here with GI cocktail and sucralfate with some improvement.  Will DC with prescription for sucralfate.  Did recommend continued use of these medications, but if not improving after several weeks, I have given information for as needed follow-up with GI.  Strict return provided.  Patient discharged stable condition.      FINAL CLINICAL IMPRESSION(S) / ED DIAGNOSES   Final diagnoses:  Epigastric pain     Rx / DC Orders   ED Discharge Orders          Ordered    sucralfate (CARAFATE) 1 g tablet  3 times daily        05/16/23 1407             Note:  This document was prepared using Dragon voice recognition software and may include unintentional dictation errors.   Steven Post, MD 05/16/23 [phone]

## 2023-05-16 NOTE — ED Notes (Signed)
Pt to ED with multiple complaints; hoarse and dry throat, gassy and bloated, intermittent back pain, intermittent abdominal pain, intermittent nausea. Pt in no acute distress. Skin dry, respirations unlabored.

## 2023-05-16 NOTE — Discharge Instructions (Addendum)
You were seen in the ER today for your abdominal and chest pain.  I reviewed your testing from your recent urgent care visit and your test here today were reassuring.  I sent a prescription for sucralfate to your pharmacy that you can take in addition to continuing your PPI.  If you are not improving within a few weeks, I have included information for follow-up with GI.  Return to the ER for new or worsening symptoms.

## 2023-05-21 DIAGNOSIS — R0789 Other chest pain: Secondary | ICD-10-CM | POA: Diagnosis not present

## 2023-05-21 DIAGNOSIS — R1013 Epigastric pain: Secondary | ICD-10-CM | POA: Diagnosis not present

## 2023-05-21 DIAGNOSIS — R079 Chest pain, unspecified: Secondary | ICD-10-CM | POA: Diagnosis not present

## 2023-05-21 DIAGNOSIS — K219 Gastro-esophageal reflux disease without esophagitis: Secondary | ICD-10-CM | POA: Diagnosis not present

## 2023-05-25 ENCOUNTER — Encounter: Payer: Self-pay | Admitting: Surgery

## 2023-05-25 ENCOUNTER — Ambulatory Visit: Payer: BC Managed Care – PPO | Admitting: Surgery

## 2023-05-25 VITALS — BP 126/83 | HR 86 | Temp 98.0°F | Ht 66.0 in | Wt 166.0 lb

## 2023-05-25 DIAGNOSIS — K4090 Unilateral inguinal hernia, without obstruction or gangrene, not specified as recurrent: Secondary | ICD-10-CM

## 2023-05-25 NOTE — Progress Notes (Signed)
05/25/2023  History of Present Illness: Steven Odonnell is a 57 y.o. male presenting for follow up of a right inguinal hernia.  He reports that his hernia has become more bothersome, with more discomfort compared to his last visit with me on 04/06/23.  He reports that since his last visit, he has needed to be more active at work which is causing more aggravation of his hernia.  He also has been recently dealing with acid reflux issues with epigastric pain and has been seen by GI recently.  He is scheduled for undergo EDG and colonoscopy on 06/03/23.  He is currently on Protonix and famotidine.  Past Medical History: History reviewed. No pertinent past medical history.   Past Surgical History: Past Surgical History:  Procedure Laterality Date   TONSILLECTOMY      Home Medications: Prior to Admission medications   Medication Sig Start Date End Date Taking? Authorizing Provider  pantoprazole (PROTONIX) 40 MG tablet Take 1 tablet (40 mg total) by mouth daily. 05/08/23 06/07/23 Yes Eusebio Friendly B, PA-C  sucralfate (CARAFATE) 1 g tablet Take 1 tablet (1 g total) by mouth 3 (three) times daily for 14 days. 05/16/23 05/30/23 Yes [name], MD    Allergies: No Known Allergies  Review of Systems: Review of Systems  Constitutional:  Negative for chills and fever.  HENT:  Negative for hearing loss.   Respiratory:  Negative for shortness of breath.   Cardiovascular:  Negative for chest pain.  Gastrointestinal:  Positive for abdominal pain. Negative for nausea and vomiting.  Genitourinary:  Negative for dysuria.  Musculoskeletal:  Negative for myalgias.  Skin:  Negative for rash.  Neurological:  Negative for dizziness.  Psychiatric/Behavioral:  Negative for depression.     Physical Exam BP 126/83   Pulse 86   Temp 98 F (36.7 C)   Ht 5\' 6"  (1.676 m)   Wt 166 lb (75.3 kg)   SpO2 97%   BMI 26.79 kg/m  CONSTITUTIONAL: No acute distress, well nourished. HEENT:  Normocephalic, atraumatic,  extraocular motion intact. NECK:  Trachea is midline, no jugular venous distention RESPIRATORY:  Lungs are clear, and breath sounds are equal bilaterally. Normal respiratory effort without pathologic use of accessory muscles. CARDIOVASCULAR: Heart is regular without murmurs, gallops, or rubs. GI: The abdomen is soft, non-distended, with localized discomfort in the right groin.  He has a small right inguinal hernia, which is reducible.  No hernia palpable on the left groin.  MUSCULOSKELETAL:  Normal gait, no peripheral edema. NEUROLOGIC:  Motor and sensation is grossly normal.  Cranial nerves are grossly intact. PSYCH:  Alert and oriented to person, place and time. Affect is normal.  Labs/Imaging: Labs on 05/08/23: Na 138, K 4.2, Cl 105, CO2 23, BUN 21, Cr 0.89.  LFTs within normal.  WBC 9.1, Hgb 14.7, Hct 43.8, Plt 291.   Assessment and Plan: This is a 57 y.o. male with a right inguinal hernia.  --The patient's right inguinal hernia has become more symptomatic since his last visit with me.  He has been more active at work and having to do more heavy lifting.  Discussed with him that this can certainly aggravate his hernia and symptoms.  His hernia on exam today remains reducible and is small in size.  Discussed with him that the right inguinal hernia would not be contributing to symptoms of GERD or epigastric pain.  Unfortunately these are separate issues.  Discussed with him timing for inguinal hernia repair and at the end the  patient has decided to proceed with surgery after his EGD/colonoscopy.  I think that's reasonable given that his procedures are next week. --Discussed with him then the plan for a robotic assisted right inguinal hernia repair, and reviewed the surgery at length with him including the planned incisions, the risks of bleeding, infection, injury to surrounding structures, that this would be an outpatient procedure, the ability to evaluate the left groin and if any issues,  repair a hernia there at the same time, post-operative activity restrictions, pain control, and he's willing to proceed. --Will schedule him for surgery on 06/11/23.  All of his questions have been answered.  I spent 40 minutes dedicated to the care of this patient on the date of this encounter to include pre-visit review of records, face-to-face time with the patient discussing diagnosis and management, and any post-visit coordination of care.   Howie Ill, MD  [facility]

## 2023-05-25 NOTE — Patient Instructions (Signed)
Follow up with Gastroenterology as scheduled.   You have chose to have your hernia repaired. This will be done by Dr. Aleen Campi at [facility].  Please see your (blue) Pre-care information that you have been given today. Our surgery scheduler will call you to verify surgery date and to go over information.   You will need to arrange to be out of work for approximately 1-2 weeks and then you may return with a lifting restriction for 4 more weeks. If you have FMLA or Disability paperwork that needs to be filled out, please have your company fax your paperwork to [phone] or you may drop this by either office. This paperwork will be filled out within 3 days after your surgery has been completed.  You may have a bruise in your groin and also swelling and brusing in your testicle area. You may use ice 4-5 times daily for 15-20 minutes each time. Make sure that you place a barrier between you and the ice pack. To decrease the swelling, you may roll up a bath towel and place it vertically in between your thighs with your testicles resting on the towel. You will want to keep this area elevated as much as possible for several days following surgery.    Inguinal Hernia, Adult Muscles help keep everything in the body in its proper place. But if a weak spot in the muscles develops, something can poke through. That is called a hernia. When this happens in the lower part of the belly (abdomen), it is called an inguinal hernia. (It takes its name from a part of the body in this region called the inguinal canal.) A weak spot in the wall of muscles lets some fat or part of the small intestine bulge through. An inguinal hernia can develop at any age. Men get them more often than women. CAUSES  In adults, an inguinal hernia develops over time. It can be triggered by: Suddenly straining the muscles of the lower abdomen. Lifting heavy objects. Straining to have a bowel movement. Difficult bowel movements (constipation)  can lead to this. Constant coughing. This may be caused by smoking or lung disease. Being overweight. Being pregnant. Working at a job that requires long periods of standing or heavy lifting. Having had an inguinal hernia before. One type can be an emergency situation. It is called a strangulated inguinal hernia. It develops if part of the small intestine slips through the weak spot and cannot get back into the abdomen. The blood supply can be cut off. If that happens, part of the intestine may die. This situation requires emergency surgery. SYMPTOMS  Often, a small inguinal hernia has no symptoms. It is found when a healthcare provider does a physical exam. Larger hernias usually have symptoms.  In adults, symptoms may include: A lump in the groin. This is easier to see when the person is standing. It might disappear when lying down. In men, a lump in the scrotum. Pain or burning in the groin. This occurs especially when lifting, straining or coughing. A dull ache or feeling of pressure in the groin. Signs of a strangulated hernia can include: A bulge in the groin that becomes very painful and tender to the touch. A bulge that turns red or purple. Fever, nausea and vomiting. Inability to have a bowel movement or to pass gas. DIAGNOSIS  To decide if you have an inguinal hernia, a healthcare provider will probably do a physical examination. This will include asking questions about any symptoms  you have noticed. The healthcare provider might feel the groin area and ask you to cough. If an inguinal hernia is felt, the healthcare provider may try to slide it back into the abdomen. Usually no other tests are needed. TREATMENT  Treatments can vary. The size of the hernia makes a difference. Options include: Watchful waiting. This is often suggested if the hernia is small and you have had no symptoms. No medical procedure will be done unless symptoms develop. You will need to watch closely for  symptoms. If any occur, contact your healthcare provider right away. Surgery. This is used if the hernia is larger or you have symptoms. Open surgery. This is usually an outpatient procedure (you will not stay overnight in a hospital). An cut (incision) is made through the skin in the groin. The hernia is put back inside the abdomen. The weak area in the muscles is then repaired by herniorrhaphy or hernioplasty. Herniorrhaphy: in this type of surgery, the weak muscles are sewn back together. Hernioplasty: a patch or mesh is used to close the weak area in the abdominal wall. Laparoscopy. In this procedure, a surgeon makes small incisions. A thin tube with a tiny video camera (called a laparoscope) is put into the abdomen. The surgeon repairs the hernia with mesh by looking with the video camera and using two long instruments. HOME CARE INSTRUCTIONS  After surgery to repair an inguinal hernia: You will need to take pain medicine prescribed by your healthcare provider. Follow all directions carefully. You will need to take care of the wound from the incision. Your activity will be restricted for awhile. This will probably include no heavy lifting for several weeks. You also should not do anything too active for a few weeks. When you can return to work will depend on the type of job that you have. During "watchful waiting" periods, you should: Maintain a healthy weight. Eat a diet high in fiber (fruits, vegetables and whole grains). Drink plenty of fluids to avoid constipation. This means drinking enough water and other liquids to keep your urine clear or pale yellow. Do not lift heavy objects. Do not stand for long periods of time. Quit smoking. This should keep you from developing a frequent cough. SEEK MEDICAL CARE IF:  A bulge develops in your groin area. You feel pain, a burning sensation or pressure in the groin. This might be worse if you are lifting or straining. You develop a fever of more  than 100.5 F (38.1 C). SEEK IMMEDIATE MEDICAL CARE IF:  Pain in the groin increases suddenly. A bulge in the groin gets bigger suddenly and does not go down. For men, there is sudden pain in the scrotum. Or, the size of the scrotum increases. A bulge in the groin area becomes red or purple and is painful to touch. You have nausea or vomiting that does not go away. You feel your heart beating much faster than normal. You cannot have a bowel movement or pass gas. You develop a fever of more than 102.0 F (38.9 C).   This information is not intended to replace advice given to you by your health care provider. Make sure you discuss any questions you have with your health care provider.   Document Released: 11/30/2008 Document Revised: 10/06/2011 Document Reviewed: 01/15/2015 Elsevier Interactive Patient Education [employer]

## 2023-05-26 ENCOUNTER — Telehealth: Payer: Self-pay | Admitting: Surgery

## 2023-05-26 NOTE — Telephone Encounter (Signed)
Left message for patient to call, please inform him of the following regarding scheduled surgery with Dr. Aleen Campi.   Pre-Admission date/time, and Surgery date at [facility].  Surgery Date: 06/11/23 Preadmission Testing Date: 06/04/23 (phone 8a-1p)  Also patient will need to call at [phone], between 1-3:00pm the day before surgery, to find out what time to arrive for surgery.

## 2023-05-27 ENCOUNTER — Other Ambulatory Visit: Payer: Self-pay | Admitting: Nurse Practitioner

## 2023-05-27 DIAGNOSIS — K219 Gastro-esophageal reflux disease without esophagitis: Secondary | ICD-10-CM

## 2023-05-27 DIAGNOSIS — K4090 Unilateral inguinal hernia, without obstruction or gangrene, not specified as recurrent: Secondary | ICD-10-CM

## 2023-05-27 DIAGNOSIS — R1084 Generalized abdominal pain: Secondary | ICD-10-CM

## 2023-05-28 ENCOUNTER — Ambulatory Visit: Payer: BC Managed Care – PPO

## 2023-05-28 ENCOUNTER — Other Ambulatory Visit: Payer: Self-pay | Admitting: Nurse Practitioner

## 2023-05-28 ENCOUNTER — Ambulatory Visit
Admission: RE | Admit: 2023-05-28 | Discharge: 2023-05-28 | Disposition: A | Discharge status: Home / Self Care | Payer: BC Managed Care – PPO | Source: Ambulatory Visit | Attending: Nurse Practitioner | Admitting: Nurse Practitioner

## 2023-05-28 DIAGNOSIS — R079 Chest pain, unspecified: Secondary | ICD-10-CM | POA: Diagnosis not present

## 2023-05-28 DIAGNOSIS — K219 Gastro-esophageal reflux disease without esophagitis: Secondary | ICD-10-CM | POA: Diagnosis not present

## 2023-05-28 DIAGNOSIS — R911 Solitary pulmonary nodule: Secondary | ICD-10-CM

## 2023-05-28 DIAGNOSIS — R109 Unspecified abdominal pain: Secondary | ICD-10-CM | POA: Diagnosis not present

## 2023-05-28 DIAGNOSIS — D1809 Hemangioma of other sites: Secondary | ICD-10-CM | POA: Diagnosis not present

## 2023-05-28 DIAGNOSIS — K4090 Unilateral inguinal hernia, without obstruction or gangrene, not specified as recurrent: Secondary | ICD-10-CM | POA: Diagnosis not present

## 2023-05-28 DIAGNOSIS — R1013 Epigastric pain: Secondary | ICD-10-CM | POA: Insufficient documentation

## 2023-05-28 DIAGNOSIS — R0789 Other chest pain: Secondary | ICD-10-CM | POA: Diagnosis not present

## 2023-05-28 DIAGNOSIS — R1084 Generalized abdominal pain: Secondary | ICD-10-CM | POA: Insufficient documentation

## 2023-05-28 MED ORDER — IOHEXOL 300 MG/ML  SOLN
100.0000 mL | Freq: Once | INTRAMUSCULAR | Status: AC | PRN
  Administered 2023-05-28: 100 mL via INTRAVENOUS

## 2023-05-28 NOTE — Telephone Encounter (Signed)
Called patient again regarding his surgery.  Patient states that at this time, he does not want to pursue doing surgery, says "too much going on in his life right now".  Will call back when he is ready.  Patient last saw Dr. Aleen Campi in office on 05/25/23.  If more than 30 days, patient will need another follow up in office prior to rescheduling surgery.

## 2023-06-03 ENCOUNTER — Ambulatory Visit: Payer: BC Managed Care – PPO

## 2023-06-03 DIAGNOSIS — K640 First degree hemorrhoids: Secondary | ICD-10-CM | POA: Diagnosis not present

## 2023-06-03 DIAGNOSIS — K449 Diaphragmatic hernia without obstruction or gangrene: Secondary | ICD-10-CM | POA: Diagnosis not present

## 2023-06-03 DIAGNOSIS — D123 Benign neoplasm of transverse colon: Secondary | ICD-10-CM | POA: Diagnosis not present

## 2023-06-03 DIAGNOSIS — R1084 Generalized abdominal pain: Secondary | ICD-10-CM | POA: Diagnosis not present

## 2023-06-03 DIAGNOSIS — K219 Gastro-esophageal reflux disease without esophagitis: Secondary | ICD-10-CM | POA: Diagnosis not present

## 2023-06-04 ENCOUNTER — Other Ambulatory Visit: Payer: BC Managed Care – PPO

## 2023-06-11 ENCOUNTER — Ambulatory Visit: Admit: 2023-06-11 | Payer: BC Managed Care – PPO | Admitting: Surgery

## 2023-06-11 SURGERY — REPAIR, HERNIA, INGUINAL, ROBOT-ASSISTED, LAPAROSCOPIC, USING MESH
Anesthesia: General | Laterality: Right

## 2023-06-29 ENCOUNTER — Ambulatory Visit (INDEPENDENT_AMBULATORY_CARE_PROVIDER_SITE_OTHER): Payer: BC Managed Care – PPO | Admitting: Physician Assistant

## 2023-06-29 ENCOUNTER — Encounter: Payer: Self-pay | Admitting: Physician Assistant

## 2023-06-29 VITALS — BP 114/76 | HR 80 | Temp 97.5°F | Ht 66.0 in | Wt 170.0 lb

## 2023-06-29 DIAGNOSIS — Z860101 Personal history of adenomatous and serrated colon polyps: Secondary | ICD-10-CM | POA: Insufficient documentation

## 2023-06-29 DIAGNOSIS — Z13220 Encounter for screening for lipoid disorders: Secondary | ICD-10-CM

## 2023-06-29 DIAGNOSIS — R911 Solitary pulmonary nodule: Secondary | ICD-10-CM | POA: Insufficient documentation

## 2023-06-29 DIAGNOSIS — K219 Gastro-esophageal reflux disease without esophagitis: Secondary | ICD-10-CM | POA: Insufficient documentation

## 2023-06-29 DIAGNOSIS — I700 Atherosclerosis of aorta: Secondary | ICD-10-CM | POA: Diagnosis not present

## 2023-06-29 DIAGNOSIS — K449 Diaphragmatic hernia without obstruction or gangrene: Secondary | ICD-10-CM | POA: Diagnosis not present

## 2023-06-29 DIAGNOSIS — I2510 Atherosclerotic heart disease of native coronary artery without angina pectoris: Secondary | ICD-10-CM | POA: Insufficient documentation

## 2023-06-29 DIAGNOSIS — K640 First degree hemorrhoids: Secondary | ICD-10-CM | POA: Insufficient documentation

## 2023-06-29 MED ORDER — ATORVASTATIN CALCIUM 10 MG PO TABS: 10.0000 mg | ORAL_TABLET | Freq: Every day | ORAL | 3 refills | Status: DC

## 2023-06-29 NOTE — Patient Instructions (Signed)

## 2023-06-29 NOTE — Progress Notes (Signed)
Date:  06/29/2023   Name:  Steven Odonnell   DOB:  November 04, 1965   MRN:  [id number]   Chief Complaint: Establish Care and Lung Mass Steven Odonnell to [facility] they did a chest xray a mass was found, no difficulty breathing)  HPI Steven Odonnell is a pleasant 57 year old male with history of smoking (quit 1999 after 10 pack years) who presents to the clinic to establish care and discuss recent workup.  He was seen in the urgent care in October for chest pain/epigastric pain prompting cardiopulmonary workup, CXR performed at that time suspicious for 1.6 cm lung nodule but later clarified on CT to be a bony [place] (though he does have small pulmonary nodules).  Also recently had endoscopy revealing small hiatal hernia and gastritis (neg H. Pylori) and colonoscopy with small tubular adenoma with recommended follow-up in 7 years.  Currently treating GERD with pantoprazole which seems to be doing fairly well for him.  Has not seen a PCP in about 15-20 years.    Medication list has been reviewed and updated.  Current Meds  Medication Sig   atorvastatin (LIPITOR) 10 MG tablet Take 1 tablet (10 mg total) by mouth daily.   pantoprazole (PROTONIX) 40 MG tablet Take 1 tablet (40 mg total) by mouth daily.     Review of Systems  Patient Active Problem List   Diagnosis Date Noted   Coronary artery disease 06/29/2023   Aortic atherosclerosis (HCC) 06/29/2023   Pulmonary nodule 06/29/2023   History of adenomatous polyp of colon 06/29/2023   GERD without esophagitis 06/29/2023   Grade I internal hemorrhoids 06/29/2023   Hiatal hernia 06/29/2023    No Known Allergies   There is no immunization history on file for this patient.  Past Surgical History:  Procedure Laterality Date   TONSILLECTOMY      Social History   Tobacco Use   Smoking status: Former    Current packs/day: 0.00    Average packs/day: 1 pack/day for 9.0 years (9.0 ttl pk-yrs)    Types: Cigarettes    Start date: 03/16/1989    Quit date:  03/16/1998    Years since quitting: 25.3    Passive exposure: Past   Smokeless tobacco: Never  Vaping Use   Vaping status: Never Used  Substance Use Topics   Alcohol use: Not Currently   Drug use: No    Family History  Problem Relation Age of Onset   Diabetes Mother    Hypertension Mother    Lung cancer Father         06/29/2023    1:56 PM  GAD 7 : Generalized Anxiety Score  Nervous, Anxious, on Edge 0  Control/stop worrying 0  Worry too much - different things 0  Trouble relaxing 0  Restless 0  Easily annoyed or irritable 0  Afraid - awful might happen 0  Total GAD 7 Score 0  Anxiety Difficulty Not difficult at all       06/29/2023    1:56 PM  Depression screen PHQ 2/9  Decreased Interest 0  Down, Depressed, Hopeless 0  PHQ - 2 Score 0  Altered sleeping 0  Tired, decreased energy 0  Change in appetite 0  Feeling bad or failure about yourself  0  Trouble concentrating 0  Moving slowly or fidgety/restless 0  Suicidal thoughts 0  PHQ-9 Score 0  Difficult doing work/chores Not difficult at all    BP Readings from Last 3 Encounters:  06/29/23 114/76  05/25/23 126/83  05/16/23 118/77    Wt Readings from Last 3 Encounters:  06/29/23 170 lb (77.1 kg)  05/25/23 166 lb (75.3 kg)  05/16/23 170 lb (77.1 kg)    BP 114/76   Pulse 80   Temp (!) 97.5 F (36.4 C) (Oral)   Ht 5\' 6"  (1.676 m)   Wt 170 lb (77.1 kg)   SpO2 94%   BMI 27.44 kg/m   Physical Exam Vitals and nursing note reviewed.  Constitutional:      Appearance: Normal appearance.  Neck:     Vascular: No carotid bruit.  Cardiovascular:     Rate and Rhythm: Normal rate and regular rhythm.     Heart sounds: No murmur heard.    No friction rub. No gallop.  Pulmonary:     Effort: Pulmonary effort is normal.     Breath sounds: Normal breath sounds.  Abdominal:     General: There is no distension.  Musculoskeletal:        General: Normal range of motion.  Skin:    General: Skin is warm and  dry.  Neurological:     Mental Status: He is alert and oriented to person, place, and time.     Gait: Gait is intact.  Psychiatric:        Mood and Affect: Mood and affect normal.     Recent Labs     Component Value Date/Time   NA 138 05/08/2023 0858   K 4.2 05/08/2023 0858   CL 105 05/08/2023 0858   CO2 23 05/08/2023 0858   GLUCOSE 117 (H) 05/08/2023 0858   BUN 21 (H) 05/08/2023 0858   CREATININE 0.89 05/08/2023 0858   CALCIUM 9.3 05/08/2023 0858   PROT 8.3 (H) 05/08/2023 0858   ALBUMIN 4.5 05/08/2023 0858   AST 32 05/08/2023 0858   ALT 29 05/08/2023 0858   ALKPHOS 98 05/08/2023 0858   BILITOT 0.7 05/08/2023 0858   GFRNONAA >60 05/08/2023 0858   GFRAA >60 03/14/2016 1706    Lab Results  Component Value Date   WBC 9.1 05/08/2023   HGB 14.7 05/08/2023   HCT 43.8 05/08/2023   MCV 87.6 05/08/2023   PLT 291 05/08/2023   No results found for: "HGBA1C" No results found for: "CHOL", "HDL", "LDLCALC", "LDLDIRECT", "TRIG", "CHOLHDL" No results found for: "TSH"   Assessment and Plan:  1. Pulmonary nodule Patient reassured that the larger lesion found on CXR in October was actually clarified to be definitively benign bony island per radiology.  He does have other pulmonary nodules, but these are much smaller and did not require dedicated follow-up.  It has been 25 years since smoking cessation.  2. Hiatal hernia Continue pantoprazole for now, continue specialist follow-up.  3. GERD without esophagitis Continue pantoprazole for now, continue specialist follow-up.  4. Coronary artery disease involving native coronary artery of native heart, unspecified whether angina present CAD found on imaging, appears mild based on the CT images that I have reviewed.  Probably not related to his chest discomfort (more likely this is GERD/hernia).  We will check nonfasting lipids today.  Regardless of result, discussed with patient I do think it would be a good idea to begin low-dose  atorvastatin to stabilize plaques and reduce his overall ASCVD risk.  He is in agreement, initiate atorvastatin as below. - Lipid panel - atorvastatin (LIPITOR) 10 MG tablet; Take 1 tablet (10 mg total) by mouth daily.  Dispense: 30 tablet; Refill: 3  5. Aortic atherosclerosis (HCC) Plan as above -  Lipid panel - atorvastatin (LIPITOR) 10 MG tablet; Take 1 tablet (10 mg total) by mouth daily.  Dispense: 30 tablet; Refill: 3  6. Screening for hyperlipidemia Plan as above - Lipid panel   Return in about 3 months (around 09/27/2023) for CPE .    Alvester Morin, PA-C, DMSc, Nutritionist [facility] [phone]

## 2023-06-30 ENCOUNTER — Telehealth: Payer: Self-pay

## 2023-06-30 ENCOUNTER — Telehealth: Payer: Self-pay | Admitting: Physician Assistant

## 2023-06-30 LAB — LIPID PANEL
Chol/HDL Ratio: 6 {ratio} — ABNORMAL HIGH (ref 0.0–5.0)
Cholesterol, Total: 239 mg/dL — ABNORMAL HIGH (ref 100–199)
HDL: 40 mg/dL (ref >39)
LDL Chol Calc (NIH): 132 mg/dL — ABNORMAL HIGH (ref 0–99)
Triglycerides: 375 mg/dL — ABNORMAL HIGH (ref 0–149)
VLDL Cholesterol Cal: 67 mg/dL — ABNORMAL HIGH (ref 5–40)

## 2023-06-30 NOTE — Telephone Encounter (Signed)
Called patient but his VM is not set up. Cholesterol a little high. I stand by yesterday's choice to begin low dose atorvastatin.   I recommend reducing consumption of foods high in saturated fats. High-fiber foods such as fruits, vegetables, beans, whole grains, and nuts are encouraged. As always, regular physical activity is recommended for cholesterol metabolism.

## 2023-06-30 NOTE — Telephone Encounter (Signed)
Pt called for lab results. Shared provider's note.   Remo Lipps, PA 06/30/2023  8:14 AM EST Back to Top    Mixed hyperlipidemia on nonfasting lipid screen. The 10-year ASCVD risk score (Arnett DK, et al., 2019) is: 7.9%. I stand by yesterday's choice to begin low dose atorvastatin.   I recommend reducing consumption of foods high in saturated fats. High-fiber foods such as fruits, vegetables, beans, whole grains, and nuts are encouraged. As always, regular physical activity is recommended for cholesterol metabolism.

## 2023-07-09 ENCOUNTER — Ambulatory Visit: Payer: Self-pay

## 2023-07-09 NOTE — Telephone Encounter (Signed)
Message from Phill Myron sent at 07/09/2023  2:04 PM EST  Summary: atorvastatin (LIPITOR) 10 MG tablet bel believe to cause lower abdominal pain , pelvic pain   atorvastatin (LIPITOR) 10 MG tablet [[id number]]       pt believes med to have caused lower abdominal pain and  pelvic area pain. Stop taking.  Been off for a week and feeling okay.  Should I take something else.         Chief Complaint: pt stated side effect of taking Atorvastatin caused lower abd pain. Pt stopped med last Friday. Pt stated sx went away and no sx today. Symptoms: none  Pertinent Negatives: Patient denies amy sx today Disposition: [] ED /[] Urgent Care (no appt availability in office) / [] Appointment(In office/virtual)/ []  [facility]/ [] Home Care/ [x] Refused Recommended Disposition /[] [facility]/ []  Follow-up with PCP Additional Notes: pt refused to make appt stated to send message to provider.    Reason for Disposition  [1] Caller has NON-URGENT medicine question about med that PCP prescribed AND [2] triager unable to answer question  Answer Assessment - Initial Assessment Questions 1. NAME of MEDICINE: "What medicine(s) are you calling about?"     atorvastatin 2. QUESTION: "What is your question?" (e.g., double dose of medicine, side effect)     Pt stated he had lower abd. Pain and pelvic pain after starting medication. Pt stopped med Friday and sx subsided. Pt wanting to know if he should restart to see if sx return,  or stop and prescribe a different med.  3. PRESCRIBER: "Who prescribed the medicine?" Reason: if prescribed by specialist, call should be referred to that group.     PCP 4. SYMPTOMS: "Do you have any symptoms?" If Yes, ask: "What symptoms are you having?"  "How bad are the symptoms (e.g., mild, moderate, severe)     No current sx  Protocols used: Medication Question Call-A-AH

## 2023-07-09 NOTE — Telephone Encounter (Signed)
Please review.  KP

## 2023-07-10 NOTE — Telephone Encounter (Signed)
Called pt. Read pt message from [place]. Pt verbalized understanding.  KP

## 2023-09-28 ENCOUNTER — Encounter: Payer: Self-pay | Admitting: Physician Assistant

## 2023-11-04 ENCOUNTER — Encounter: Payer: Self-pay | Admitting: Physician Assistant

## 2023-11-04 ENCOUNTER — Encounter: Payer: BC Managed Care – PPO | Admitting: Physician Assistant

## 2023-11-07 ENCOUNTER — Other Ambulatory Visit: Payer: Self-pay | Admitting: Physician Assistant

## 2023-11-07 DIAGNOSIS — I700 Atherosclerosis of aorta: Secondary | ICD-10-CM

## 2023-11-07 DIAGNOSIS — I2510 Atherosclerotic heart disease of native coronary artery without angina pectoris: Secondary | ICD-10-CM

## 2023-11-09 NOTE — Telephone Encounter (Signed)
 Requested Prescriptions  Pending Prescriptions Disp Refills   atorvastatin (LIPITOR) 10 MG tablet [Pharmacy Med Name: ATORVASTATIN 10MG  TABLETS] 90 tablet 0    Sig: TAKE 1 TABLET(10 MG) BY MOUTH DAILY     Cardiovascular:  Antilipid - Statins Failed - 11/09/2023 11:39 AM      Failed - Valid encounter within last 12 months    Recent Outpatient Visits   None            Failed - Lipid Panel in normal range within the last 12 months    Cholesterol, Total  Date Value Ref Range Status  06/29/2023 239 (H) 100 - 199 mg/dL Final   LDL Chol Calc (NIH)  Date Value Ref Range Status  06/29/2023 132 (H) 0 - 99 mg/dL Final   HDL  Date Value Ref Range Status  06/29/2023 40 >39 mg/dL Final   Triglycerides  Date Value Ref Range Status  06/29/2023 375 (H) 0 - 149 mg/dL Final         Passed - Patient is not pregnant

## 2023-11-13 ENCOUNTER — Encounter: Admitting: Physician Assistant

## 2024-02-05 ENCOUNTER — Other Ambulatory Visit: Payer: Self-pay | Admitting: Physician Assistant

## 2024-02-05 DIAGNOSIS — I700 Atherosclerosis of aorta: Secondary | ICD-10-CM

## 2024-02-05 DIAGNOSIS — I2510 Atherosclerotic heart disease of native coronary artery without angina pectoris: Secondary | ICD-10-CM

## 2024-02-08 NOTE — Telephone Encounter (Signed)
 Requested Prescriptions  Pending Prescriptions Disp Refills   atorvastatin  (LIPITOR) 10 MG tablet [Pharmacy Med Name: ATORVASTATIN  10MG  TABLETS] 90 tablet 1    Sig: TAKE 1 TABLET(10 MG) BY MOUTH DAILY     Cardiovascular:  Antilipid - Statins Failed - 02/08/2024  7:33 AM      Failed - Valid encounter within last 12 months    Recent Outpatient Visits   None            Failed - Lipid Panel in normal range within the last 12 months    Cholesterol, Total  Date Value Ref Range Status  06/29/2023 239 (H) 100 - 199 mg/dL Final   LDL Chol Calc (NIH)  Date Value Ref Range Status  06/29/2023 132 (H) 0 - 99 mg/dL Final   HDL  Date Value Ref Range Status  06/29/2023 40 >39 mg/dL Final   Triglycerides  Date Value Ref Range Status  06/29/2023 375 (H) 0 - 149 mg/dL Final         Passed - Patient is not pregnant

## 2024-04-27 ENCOUNTER — Telehealth: Payer: Self-pay

## 2024-04-27 NOTE — Telephone Encounter (Signed)
 Pt states he gave us  a form to excuse him from work as he has been taking care of his mom. On the paper it was put that he is excused 09/23-10/20 however he said she first got hospitalized on 09/09 so the dates need to be fixed and refaxed for work purposes.

## 2024-08-04 ENCOUNTER — Other Ambulatory Visit: Payer: Self-pay | Admitting: Physician Assistant

## 2024-08-04 DIAGNOSIS — I700 Atherosclerosis of aorta: Secondary | ICD-10-CM

## 2024-08-04 DIAGNOSIS — I2510 Atherosclerotic heart disease of native coronary artery without angina pectoris: Secondary | ICD-10-CM

## 2024-08-04 NOTE — Telephone Encounter (Signed)
 OFFICE VISIT NEEDED FOR ADDITIONAL REFILLS   Requested Prescriptions  Pending Prescriptions Disp Refills   atorvastatin  (LIPITOR) 10 MG tablet [Pharmacy Med Name: ATORVASTATIN  10MG  TABLETS] 90 tablet 1    Sig: TAKE 1 TABLET(10 MG) BY MOUTH DAILY     Cardiovascular:  Antilipid - Statins Failed - 08/04/2024  4:05 PM      Failed - Valid encounter within last 12 months    Recent Outpatient Visits   None            Failed - Lipid Panel in normal range within the last 12 months    Cholesterol, Total  Date Value Ref Range Status  06/29/2023 239 (H) 100 - 199 mg/dL Final   LDL Chol Calc (NIH)  Date Value Ref Range Status  06/29/2023 132 (H) 0 - 99 mg/dL Final   HDL  Date Value Ref Range Status  06/29/2023 40 >39 mg/dL Final   Triglycerides  Date Value Ref Range Status  06/29/2023 375 (H) 0 - 149 mg/dL Final         Passed - Patient is not pregnant

## 2024-08-23 ENCOUNTER — Ambulatory Visit: Admitting: Physician Assistant

## 2024-08-23 ENCOUNTER — Encounter: Payer: Self-pay | Admitting: Physician Assistant

## 2024-08-23 VITALS — BP 102/78 | HR 82 | Temp 98.5°F | Ht 66.0 in | Wt 168.6 lb

## 2024-08-23 DIAGNOSIS — Z2821 Immunization not carried out because of patient refusal: Secondary | ICD-10-CM | POA: Diagnosis not present

## 2024-08-23 DIAGNOSIS — I700 Atherosclerosis of aorta: Secondary | ICD-10-CM | POA: Diagnosis not present

## 2024-08-23 DIAGNOSIS — I2510 Atherosclerotic heart disease of native coronary artery without angina pectoris: Secondary | ICD-10-CM | POA: Diagnosis not present

## 2024-08-23 DIAGNOSIS — H6123 Impacted cerumen, bilateral: Secondary | ICD-10-CM

## 2024-08-23 DIAGNOSIS — Z125 Encounter for screening for malignant neoplasm of prostate: Secondary | ICD-10-CM | POA: Diagnosis not present

## 2024-08-23 DIAGNOSIS — Z0000 Encounter for general adult medical examination without abnormal findings: Secondary | ICD-10-CM | POA: Diagnosis not present

## 2024-08-23 DIAGNOSIS — K219 Gastro-esophageal reflux disease without esophagitis: Secondary | ICD-10-CM

## 2024-08-23 MED ORDER — PANTOPRAZOLE SODIUM 40 MG PO TBEC: 40.0000 mg | DELAYED_RELEASE_TABLET | Freq: Every day | ORAL | 1 refills | Status: AC

## 2024-08-23 MED ORDER — ATORVASTATIN CALCIUM 10 MG PO TABS: 10.0000 mg | ORAL_TABLET | Freq: Every day | ORAL | 1 refills | Status: AC

## 2024-08-23 NOTE — Progress Notes (Signed)
 "   Date:  08/23/2024   Name:  Steven Odonnell   DOB:  12-20-1965   MRN:  [id number]   Chief Complaint: Medical Management of Chronic Issues (Needs refills been out for 1 week)  HPI  Steven Odonnell returns long overdue for follow up, not seen here since Dec 2024, here today desiring medication refills. Due for routine physical exam so we will complete this today as well. At our last visit I started him on low dose atorvastatin  given his CAD and atypical chest pain (which was felt to be related to GERD). He sent a message suspecting the atorvastatin  was causing abdominal pain, but after restarting the atorvastatin  without issue, he thinks it was unrelated. Has been out of atorvastatin  for about a week.    Last Dental Exam: 1y ago Last Eye Exam: 5-10y ago Last CRC screen: 06/03/23, repeat 7y Last PSA: unknown Immunizations Due: flu, Prevnar, Shingrix, Tdap   Medication list has been reviewed and updated.  Active Medications[1]   Review of Systems  Patient Active Problem List   Diagnosis Date Noted   Coronary artery disease 06/29/2023   Aortic atherosclerosis 06/29/2023   Pulmonary nodule 06/29/2023   History of adenomatous polyp of colon 06/29/2023   GERD without esophagitis 06/29/2023   Grade I internal hemorrhoids 06/29/2023   Hiatal hernia 06/29/2023    Allergies[2]   There is no immunization history on file for this patient.  Past Surgical History:  Procedure Laterality Date   TONSILLECTOMY      Social History[3]  Family History  Problem Relation Age of Onset   Diabetes Mother    Hypertension Mother    Lung cancer Father         08/23/2024   11:49 AM 06/29/2023    1:56 PM  GAD 7 : Generalized Anxiety Score  Nervous, Anxious, on Edge 0 0   Control/stop worrying 0 0   Worry too much - different things 0 0   Trouble relaxing 0 0   Restless 0 0   Easily annoyed or irritable 0 0   Afraid - awful might happen 0 0   Total GAD 7 Score 0 0  Anxiety Difficulty Not  difficult at all Not difficult at all     Data saved with a previous flowsheet row definition       08/23/2024   11:49 AM 06/29/2023    1:56 PM  Depression screen PHQ 2/9  Decreased Interest 0 0  Down, Depressed, Hopeless 0 0  PHQ - 2 Score 0 0  Altered sleeping  0  Tired, decreased energy  0  Change in appetite  0  Feeling bad or failure about yourself   0  Trouble concentrating  0  Moving slowly or fidgety/restless  0  Suicidal thoughts  0  PHQ-9 Score  0   Difficult doing work/chores  Not difficult at all     Data saved with a previous flowsheet row definition    BP Readings from Last 3 Encounters:  08/23/24 102/78  06/29/23 114/76  05/25/23 126/83    Wt Readings from Last 3 Encounters:  08/23/24 168 lb 9.6 oz (76.5 kg)  06/29/23 170 lb (77.1 kg)  05/25/23 166 lb (75.3 kg)    BP 102/78   Pulse 82   Temp 98.5 F (36.9 C) (Oral)   Ht 5' 6 (1.676 m)   Wt 168 lb 9.6 oz (76.5 kg)   SpO2 97%   BMI 27.21 kg/m   Physical Exam  Vitals and nursing note reviewed.  Constitutional:      Appearance: Normal appearance.  HENT:     Right Ear: There is impacted cerumen.     Left Ear: There is impacted cerumen.     Nose: Nose normal.     Mouth/Throat:     Mouth: Mucous membranes are moist. No oral lesions.     Dentition: Normal dentition.     Pharynx: No posterior oropharyngeal erythema.  Eyes:     Extraocular Movements: Extraocular movements intact.     Conjunctiva/sclera: Conjunctivae normal.     Pupils: Pupils are equal, round, and reactive to light.  Neck:     Thyroid: No thyromegaly.  Cardiovascular:     Rate and Rhythm: Normal rate and regular rhythm.     Heart sounds: No murmur heard.    No friction rub. No gallop.     Comments: Pulses 2+ at radial, PT, DP bilaterally. No carotid bruit. No peripheral edema Pulmonary:     Effort: Pulmonary effort is normal.     Breath sounds: Normal breath sounds.  Abdominal:     General: Bowel sounds are normal.      Palpations: Abdomen is soft. There is no mass.     Tenderness: There is no abdominal tenderness.  Genitourinary:    Comments: Genital/rectal exam declined Musculoskeletal:     Comments: Full ROM with strength 5/5 bilateral upper and lower extremities  Lymphadenopathy:     Cervical: No cervical adenopathy.  Skin:    General: Skin is warm.     Capillary Refill: Capillary refill takes less than 2 seconds.     Findings: No lesion or rash.  Neurological:     Mental Status: He is alert and oriented to person, place, and time.     Gait: Gait is intact.  Psychiatric:        Mood and Affect: Mood normal.        Behavior: Behavior normal.     Recent Labs     Component Value Date/Time   NA 138 05/08/2023 0858   K 4.2 05/08/2023 0858   CL 105 05/08/2023 0858   CO2 23 05/08/2023 0858   GLUCOSE 117 (H) 05/08/2023 0858   BUN 21 (H) 05/08/2023 0858   CREATININE 0.89 05/08/2023 0858   CALCIUM  9.3 05/08/2023 0858   PROT 8.3 (H) 05/08/2023 0858   ALBUMIN 4.5 05/08/2023 0858   AST 32 05/08/2023 0858   ALT 29 05/08/2023 0858   ALKPHOS 98 05/08/2023 0858   BILITOT 0.7 05/08/2023 0858   GFRNONAA >60 05/08/2023 0858   GFRAA >60 03/14/2016 1706    Lab Results  Component Value Date   WBC 9.1 05/08/2023   HGB 14.7 05/08/2023   HCT 43.8 05/08/2023   MCV 87.6 05/08/2023   PLT 291 05/08/2023   No results found for: HGBA1C Lab Results  Component Value Date   CHOL 239 (H) 06/29/2023   HDL 40 06/29/2023   LDLCALC 132 (H) 06/29/2023   TRIG 375 (H) 06/29/2023   CHOLHDL 6.0 (H) 06/29/2023   No results found for: TSH      Assessment & Plan Annual physical exam Encouraged healthy lifestyle including regular physical activity and consumption of whole fruits and vegetables. Encouraged routine dental and eye exams. Vaccinations up to date.   Lab is closed at the time of exam, so patient will return soon for labs, likely tomorrow.  Orders:   PSA   Lipid panel   CBC with  Differential/Platelet  Comprehensive metabolic panel with GFR   TSH  Coronary artery disease involving native coronary artery of native heart, unspecified whether angina present Continue atorvastatin , check lipids Orders:   atorvastatin  (LIPITOR) 10 MG tablet; Take 1 tablet (10 mg total) by mouth daily.   Lipid panel  Aortic atherosclerosis Continue atorvastatin , check lipids Orders:   atorvastatin  (LIPITOR) 10 MG tablet; Take 1 tablet (10 mg total) by mouth daily.   Lipid panel  GERD without esophagitis Refill pantoprazole . Reviewed potential risks of long-term PPI. May be wise to attempt taper next visit Orders:   pantoprazole  (PROTONIX ) 40 MG tablet; Take 1 tablet (40 mg total) by mouth daily.  Immunization declined Refused all vaccinations today despite encouragement    Screening PSA (prostate specific antigen) Prostate exam declined, will check PSA Orders:   PSA  Bilateral impacted cerumen Patient wishes to attempt home irrigation      F/u 40m OV GERD, HLD F/u 1y CPE   Rolan Hoyle, PA-C, DMSc, DipACLM, Nutritionist [facility] [phone]      [1]  Current Meds  Medication Sig   [DISCONTINUED] atorvastatin  (LIPITOR) 10 MG tablet TAKE 1 TABLET(10 MG) BY MOUTH DAILY   [DISCONTINUED] pantoprazole  (PROTONIX ) 40 MG tablet Take 1 tablet (40 mg total) by mouth daily.  [2] No Known Allergies [3]  Social History Tobacco Use   Smoking status: Former    Current packs/day: 0.00    Average packs/day: 1 pack/day for 9.0 years (9.0 ttl pk-yrs)    Types: Cigarettes    Start date: 03/16/1989    Quit date: 03/16/1998    Years since quitting: 26.4    Passive exposure: Past   Smokeless tobacco: Never  Vaping Use   Vaping status: Never Used  Substance Use Topics   Alcohol use: Not Currently   Drug use: No   "

## 2024-08-23 NOTE — Assessment & Plan Note (Addendum)
 Continue atorvastatin , check lipids Orders:   atorvastatin  (LIPITOR) 10 MG tablet; Take 1 tablet (10 mg total) by mouth daily.   Lipid panel

## 2024-08-23 NOTE — Assessment & Plan Note (Addendum)
 Refill pantoprazole . Reviewed potential risks of long-term PPI. May be wise to attempt taper next visit Orders:   pantoprazole  (PROTONIX ) 40 MG tablet; Take 1 tablet (40 mg total) by mouth daily.

## 2025-02-13 ENCOUNTER — Ambulatory Visit: Admitting: Physician Assistant

## 2025-08-24 ENCOUNTER — Encounter: Admitting: Physician Assistant
